# Patient Record
Sex: Male | Born: 1970 | Race: Black or African American | Hispanic: No | Marital: Married | Smoking: Former smoker
Health system: Southern US, Community
[De-identification: ages and names within clinical notes are randomized; demographics above are authoritative.]

## PROBLEM LIST (undated history)

## (undated) DIAGNOSIS — G4730 Sleep apnea, unspecified: Secondary | ICD-10-CM

## (undated) DIAGNOSIS — I82409 Acute embolism and thrombosis of unspecified deep veins of unspecified lower extremity: Secondary | ICD-10-CM

## (undated) DIAGNOSIS — J189 Pneumonia, unspecified organism: Secondary | ICD-10-CM

## (undated) DIAGNOSIS — I739 Peripheral vascular disease, unspecified: Secondary | ICD-10-CM

## (undated) DIAGNOSIS — I10 Essential (primary) hypertension: Secondary | ICD-10-CM

## (undated) DIAGNOSIS — M1990 Unspecified osteoarthritis, unspecified site: Secondary | ICD-10-CM

## (undated) DIAGNOSIS — M109 Gout, unspecified: Secondary | ICD-10-CM

## (undated) DIAGNOSIS — T8859XA Other complications of anesthesia, initial encounter: Secondary | ICD-10-CM

## (undated) DIAGNOSIS — K219 Gastro-esophageal reflux disease without esophagitis: Secondary | ICD-10-CM

## (undated) DIAGNOSIS — T4145XA Adverse effect of unspecified anesthetic, initial encounter: Secondary | ICD-10-CM

## (undated) HISTORY — PX: WISDOM TOOTH EXTRACTION: SHX21

---
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
## 2011-05-21 ENCOUNTER — Emergency Department (HOSPITAL_BASED_OUTPATIENT_CLINIC_OR_DEPARTMENT_OTHER)
Admission: EM | Admit: 2011-05-21 | Discharge: 2011-05-22 | Disposition: A | Discharge status: Home / Self Care | Payer: Self-pay | Attending: Emergency Medicine | Admitting: Emergency Medicine

## 2011-05-21 ENCOUNTER — Encounter (HOSPITAL_BASED_OUTPATIENT_CLINIC_OR_DEPARTMENT_OTHER): Payer: Self-pay | Admitting: *Deleted

## 2011-05-21 ENCOUNTER — Emergency Department (INDEPENDENT_AMBULATORY_CARE_PROVIDER_SITE_OTHER): Payer: Self-pay

## 2011-05-21 DIAGNOSIS — M25469 Effusion, unspecified knee: Secondary | ICD-10-CM | POA: Insufficient documentation

## 2011-05-21 DIAGNOSIS — M25569 Pain in unspecified knee: Secondary | ICD-10-CM

## 2011-05-21 DIAGNOSIS — I10 Essential (primary) hypertension: Secondary | ICD-10-CM | POA: Insufficient documentation

## 2011-05-21 DIAGNOSIS — M7989 Other specified soft tissue disorders: Secondary | ICD-10-CM

## 2011-05-21 DIAGNOSIS — Z79899 Other long term (current) drug therapy: Secondary | ICD-10-CM | POA: Insufficient documentation

## 2011-05-21 HISTORY — DX: Essential (primary) hypertension: I10

## 2011-05-21 MED ORDER — NAPROXEN 250 MG PO TABS
500.0000 mg | ORAL_TABLET | Freq: Once | ORAL | Status: AC
  Administered 2011-05-21: 500 mg via ORAL
  Filled 2011-05-21: qty 2

## 2011-05-21 MED ORDER — OXYCODONE-ACETAMINOPHEN 5-325 MG PO TABS
2.0000 | ORAL_TABLET | Freq: Once | ORAL | Status: AC
  Administered 2011-05-21: 2 via ORAL
  Filled 2011-05-21: qty 2

## 2011-05-21 NOTE — ED Notes (Signed)
Woke with left knee pain a week ago. Pain is getting worse each day. No known injury. Swelling and grinding noises per pt.

## 2011-05-22 MED ORDER — NAPROXEN 500 MG PO TABS: 500.0000 mg | ORAL_TABLET | Freq: Two times a day (BID) | ORAL | Status: AC

## 2011-05-22 NOTE — Discharge Instructions (Signed)
Your x-rays show a very small amount of fluid around your knee joint, please use the pain medication as prescribed and followup with the sports medicine Dr. or your family doctor.

## 2011-05-22 NOTE — ED Provider Notes (Signed)
History     CSN: [id number]  Arrival date & time 05/21/11  2236   First MD Initiated Contact with Patient 05/21/11 2341      Chief Complaint  Patient presents with  . Knee Pain    (Consider location/radiation/quality/duration/timing/severity/associated sxs/prior treatment) HPI Comments: Patient presents with gradual onset of left knee pain that started approximately 2 weeks ago. There was no inciting event, no identifiable trauma, has had associated mild swelling and decreased range of motion secondary to pain. The pain is persistent, worse with flexion and weightbearing, better with rest and elevation. He has had over-the-counter medications prior to arrival with minimal improvement. He denies associated fever, urethral discharge, rash. He denies trauma and has no other joint arthropathies  Patient is a 41 y.o. male presenting with knee pain. The history is provided by the patient and the spouse.  Knee Pain    Past Medical History  Diagnosis Date  . Hypertension     History reviewed. No pertinent past surgical history.  No family history on file.  History  Substance Use Topics  . Smoking status: Current Some Day Smoker  . Smokeless tobacco: Not on file  . Alcohol Use: Yes      Review of Systems  Constitutional: Negative for fever.  Gastrointestinal: Negative for nausea, vomiting and diarrhea.  Musculoskeletal: Positive for joint swelling.  Skin: Negative for rash.  Neurological: Negative for dizziness, weakness and numbness.    Allergies  Review of patient's allergies indicates no known allergies.  Home Medications   Current Outpatient Rx  Name Route Sig Dispense Refill  . BENAZEPRIL HCL PO Oral Take by mouth.    Marland Kitchen HYDROCHLOROTHIAZIDE PO Oral Take by mouth.    Marland Kitchen NAPROXEN 500 MG PO TABS Oral Take 1 tablet (500 mg total) by mouth 2 (two) times daily with a meal. 30 tablet 0    BP 124/82  Pulse 102  Temp(Src) 98.5 F (36.9 C) (Oral)  Resp 18  SpO2  100%  Physical Exam  Nursing note and vitals reviewed. Constitutional: He appears well-developed and well-nourished. No distress.  HENT:  Head: Normocephalic and atraumatic.  Eyes: Conjunctivae are normal. No scleral icterus.  Cardiovascular: Normal rate, regular rhythm and intact distal pulses.   Pulmonary/Chest: Effort normal and breath sounds normal.  Musculoskeletal: He exhibits tenderness ( Left knee with mild tenderness to the joint, pain with range of motion, no obvious effusions or swelling or asymmetry or redness or warmth. No crepitance with range of motion, no other joint abnormalities). He exhibits no edema.  Neurological: He is alert.  Skin: Skin is warm and dry. No rash noted. He is not diaphoretic.    ED Course  Procedures (including critical care time)  Labs Reviewed - No data to display Dg Knee 1-2 Views Left  05/22/2011  *RADIOLOGY REPORT*  Clinical Data: Left knee pain and swelling.  LEFT KNEE - 1-2 VIEW  Comparison: None  Findings: There is no evidence of fracture, subluxation or dislocation. There may be a small knee effusion present. No focal bony lesions are identified.  IMPRESSION: Question small knee effusion - no evidence of bony abnormality.  Original Report Authenticated By: Rosendo Gros, M.D.     1. Knee pain       MDM  Overall the patient appears well without fever or tachycardia. He has focal arthropathy of the left knee which could be early gouty arthritis versus inflammatory arthritis. Doubt hemarthrosis, doubt septic arthritis given history physical exam and vital signs,  see below.  Filed Vitals:   05/21/11 2248  BP: 124/82  Pulse: 102  Temp: 98.5 F (36.9 C)  Resp: 18   X-rays negative for acute fracture or abnormality, oral Percocet and Naprosyn given in the emergency department  Discharge Prescriptions include:  Naprosyn      Vida Roller, MD 05/22/11 0130

## 2013-11-19 ENCOUNTER — Encounter (HOSPITAL_BASED_OUTPATIENT_CLINIC_OR_DEPARTMENT_OTHER): Payer: Self-pay | Admitting: Emergency Medicine

## 2013-11-19 ENCOUNTER — Emergency Department (HOSPITAL_BASED_OUTPATIENT_CLINIC_OR_DEPARTMENT_OTHER)
Admission: EM | Admit: 2013-11-19 | Discharge: 2013-11-19 | Disposition: A | Discharge status: Home / Self Care | Payer: BC Managed Care – PPO | Attending: Emergency Medicine | Admitting: Emergency Medicine

## 2013-11-19 ENCOUNTER — Emergency Department (HOSPITAL_BASED_OUTPATIENT_CLINIC_OR_DEPARTMENT_OTHER): Payer: BC Managed Care – PPO

## 2013-11-19 DIAGNOSIS — F17200 Nicotine dependence, unspecified, uncomplicated: Secondary | ICD-10-CM | POA: Insufficient documentation

## 2013-11-19 DIAGNOSIS — IMO0002 Reserved for concepts with insufficient information to code with codable children: Secondary | ICD-10-CM | POA: Diagnosis not present

## 2013-11-19 DIAGNOSIS — S59902A Unspecified injury of left elbow, initial encounter: Secondary | ICD-10-CM

## 2013-11-19 DIAGNOSIS — S6990XA Unspecified injury of unspecified wrist, hand and finger(s), initial encounter: Principal | ICD-10-CM

## 2013-11-19 DIAGNOSIS — S59909A Unspecified injury of unspecified elbow, initial encounter: Secondary | ICD-10-CM | POA: Diagnosis present

## 2013-11-19 DIAGNOSIS — Y9289 Other specified places as the place of occurrence of the external cause: Secondary | ICD-10-CM | POA: Diagnosis not present

## 2013-11-19 DIAGNOSIS — W010XXA Fall on same level from slipping, tripping and stumbling without subsequent striking against object, initial encounter: Secondary | ICD-10-CM | POA: Insufficient documentation

## 2013-11-19 DIAGNOSIS — I10 Essential (primary) hypertension: Secondary | ICD-10-CM | POA: Diagnosis not present

## 2013-11-19 DIAGNOSIS — Y9389 Activity, other specified: Secondary | ICD-10-CM | POA: Insufficient documentation

## 2013-11-19 DIAGNOSIS — S59919A Unspecified injury of unspecified forearm, initial encounter: Principal | ICD-10-CM

## 2013-11-19 MED ORDER — OXYCODONE-ACETAMINOPHEN 5-325 MG PO TABS: 1.0000 | ORAL_TABLET | ORAL | Status: DC | PRN

## 2013-11-19 MED ORDER — OXYCODONE-ACETAMINOPHEN 5-325 MG PO TABS
2.0000 | ORAL_TABLET | Freq: Once | ORAL | Status: AC
Start: 2013-11-19 — End: 2013-11-19
  Administered 2013-11-19: 2 via ORAL
  Filled 2013-11-19: qty 2

## 2013-11-19 NOTE — ED Notes (Signed)
Pt given ice pack for home use and note for work

## 2013-11-19 NOTE — ED Notes (Signed)
MD at bedside. 

## 2013-11-19 NOTE — ED Provider Notes (Signed)
CSN: [id number]     Arrival date & time 11/19/13  1652 History  This chart was scribed for Hilario Quarry, MD by Tonye Royalty, ED Scribe. This patient was seen in room MH09/MH09 and the patient's care was started at 6:16 PM.    Chief Complaint  Patient presents with  . Arm Pain   Patient is a 43 y.o. male presenting with extremity pain. The history is provided by the patient. No language interpreter was used.  Extremity Pain This is a new problem. The current episode started 3 to 5 hours ago. The problem occurs constantly. The problem has not changed since onset.The symptoms are aggravated by bending and twisting. Nothing relieves the symptoms.    HPI Comments: Ricardo Heath is a 43 y.o. male who presents to the Emergency Department complaining of left arm pain, mostly in the elbow, with onset at 2:00PM today after slipping in the raining and falling, landing on his left arm. He states he did not land on a bent elbow. He reports minor associated back pain that resolved but no pain elsewhere. He reports prior history of "issues" to his left elbow for which he has an appointment scheduled tomorrow with orthopedist Dr. Luiz Blare. He reports history of hypertension. He reports some smoking and drinking. He reports alcohol use today prior to the fall.  Past Medical History  Diagnosis Date  . Hypertension    History reviewed. No pertinent past surgical history. No family history on file. History  Substance Use Topics  . Smoking status: Current Some Day Smoker  . Smokeless tobacco: Not on file  . Alcohol Use: Yes    Review of Systems  Musculoskeletal: Positive for back pain.       Left arm pain  Neurological: Negative for numbness.  All other systems reviewed and are negative.     Allergies  Review of patient's allergies indicates no known allergies.  Home Medications   Prior to Admission medications   Medication Sig Start Date End Date Taking? Authorizing Provider  BENAZEPRIL HCL PO  Take by mouth.    Historical Provider, MD  HYDROCHLOROTHIAZIDE PO Take by mouth.    Historical Provider, MD   BP 92/61  Pulse 117  Temp(Src) 98.5 F (36.9 C) (Oral)  Resp 20  Ht  (1.702 m)  Wt 255 lb (115.667 kg)  BMI 39.93 kg/m2  SpO2 99% Physical Exam  Nursing note and vitals reviewed. Constitutional: He is oriented to person, place, and time. He appears well-developed and well-nourished. No distress.  HENT:  Head: Normocephalic and atraumatic.  Right Ear: External ear normal.  Left Ear: External ear normal.  Nose: Nose normal.  Eyes: EOM are normal.  Neck: Normal range of motion. Neck supple.  Musculoskeletal: Normal range of motion. He exhibits tenderness.  Neurological: He is alert and oriented to person, place, and time. He exhibits normal muscle tone. Coordination normal.  Skin: Skin is warm and dry.  Psychiatric: He has a normal mood and affect. His behavior is normal. Thought content normal.    ED Course  Procedures (including critical care time) Labs Review Labs Reviewed - No data to display  Imaging Review No results found.   EKG Interpretation None     DIAGNOSTIC STUDIES: Oxygen Saturation is 99% on room air, normal by my interpretation.    COORDINATION OF CARE: 6:11 PM Discussed treatment plan with patient at beside, the patient agrees with the plan and has no further questions at this time.  MDM   Final diagnoses:  Elbow injury, left, initial encounter    I personally performed the services described in this documentation, which was scribed in my presence. The recorded information has been reviewed and considered.  Hilario Quarry, MD 11/20/13 [phone]

## 2013-11-19 NOTE — ED Notes (Signed)
EMT at bedside applying splint 

## 2013-11-19 NOTE — ED Notes (Signed)
Pt presents to ED with complaints of left arm pain after falling today on his deck pt states he slipped in the rain and fell. Swelling noted to elbow, pt crying in triage. Pt states he already has "issues" with the arm and has an appointment scheduled for tomorrow with ortho doc.

## 2013-11-19 NOTE — Discharge Instructions (Signed)
Please keep cold therapy and sling until recheck with orthopedist.

## 2015-01-11 ENCOUNTER — Encounter (HOSPITAL_BASED_OUTPATIENT_CLINIC_OR_DEPARTMENT_OTHER): Payer: Self-pay | Admitting: *Deleted

## 2015-01-11 ENCOUNTER — Emergency Department (HOSPITAL_BASED_OUTPATIENT_CLINIC_OR_DEPARTMENT_OTHER)
Admission: EM | Admit: 2015-01-11 | Discharge: 2015-01-11 | Disposition: A | Discharge status: Home / Self Care | Payer: BLUE CROSS/BLUE SHIELD | Attending: Emergency Medicine | Admitting: Emergency Medicine

## 2015-01-11 DIAGNOSIS — Z79899 Other long term (current) drug therapy: Secondary | ICD-10-CM | POA: Insufficient documentation

## 2015-01-11 DIAGNOSIS — S39012A Strain of muscle, fascia and tendon of lower back, initial encounter: Secondary | ICD-10-CM | POA: Insufficient documentation

## 2015-01-11 DIAGNOSIS — X58XXXA Exposure to other specified factors, initial encounter: Secondary | ICD-10-CM | POA: Insufficient documentation

## 2015-01-11 DIAGNOSIS — S3992XA Unspecified injury of lower back, initial encounter: Secondary | ICD-10-CM | POA: Diagnosis present

## 2015-01-11 DIAGNOSIS — F17210 Nicotine dependence, cigarettes, uncomplicated: Secondary | ICD-10-CM | POA: Diagnosis not present

## 2015-01-11 DIAGNOSIS — Y998 Other external cause status: Secondary | ICD-10-CM | POA: Insufficient documentation

## 2015-01-11 DIAGNOSIS — I10 Essential (primary) hypertension: Secondary | ICD-10-CM | POA: Insufficient documentation

## 2015-01-11 DIAGNOSIS — Y9289 Other specified places as the place of occurrence of the external cause: Secondary | ICD-10-CM | POA: Diagnosis not present

## 2015-01-11 DIAGNOSIS — Y9389 Activity, other specified: Secondary | ICD-10-CM | POA: Insufficient documentation

## 2015-01-11 MED ORDER — MELOXICAM 15 MG PO TABS: 15.0000 mg | ORAL_TABLET | Freq: Every day | ORAL | Status: DC | PRN

## 2015-01-11 MED ORDER — TRAMADOL HCL 50 MG PO TABS: 50.0000 mg | ORAL_TABLET | Freq: Four times a day (QID) | ORAL | Status: DC | PRN

## 2015-01-11 MED ORDER — DEXAMETHASONE 4 MG PO TABS
12.0000 mg | ORAL_TABLET | Freq: Once | ORAL | Status: AC
  Administered 2015-01-11: 12 mg via ORAL
  Filled 2015-01-11: qty 3

## 2015-01-11 MED ORDER — HYDROMORPHONE HCL 1 MG/ML IJ SOLN: 1.0000 mg | Freq: Once | INTRAMUSCULAR | Status: DC

## 2015-01-11 MED ORDER — KETOROLAC TROMETHAMINE 30 MG/ML IJ SOLN
30.0000 mg | Freq: Once | INTRAMUSCULAR | Status: AC
  Administered 2015-01-11: 30 mg via INTRAMUSCULAR
  Filled 2015-01-11: qty 1

## 2015-01-11 NOTE — Discharge Instructions (Signed)
Back Injury Prevention Back injuries can be very painful. They can also be difficult to heal. After having one back injury, you are more likely to injure your back again. It is important to learn how to avoid injuring or re-injuring your back. The following tips can help you to prevent a back injury. WHAT SHOULD I KNOW ABOUT PHYSICAL FITNESS?  Exercise for 30 minutes per day on most days of the week or as directed by your health care provider. Make sure to:  Do aerobic exercises, such as walking, jogging, biking, or swimming.  Do exercises that increase balance and strength, such as tai chi and yoga. These can decrease your risk of falling and injuring your back.  Do stretching exercises to help with flexibility.  Try to develop strong abdominal muscles. Your abdominal muscles provide a lot of the support that is needed by your back.  Maintain a healthy weight. This helps to decrease your risk of a back injury. WHAT SHOULD I KNOW ABOUT MY DIET?  Talk with your health care provider about your overall diet. Take supplements and vitamins only as directed by your health care provider.  Talk with your health care provider about how much calcium and vitamin D you need each day. These nutrients help to prevent weakening of the bones (osteoporosis). Osteoporosis can cause broken (fractured) bones, which lead to back pain.  Include good sources of calcium in your diet, such as dairy products, green leafy vegetables, and products that have had calcium added to them (fortified).  Include good sources of vitamin D in your diet, such as milk and foods that are fortified with vitamin D. WHAT SHOULD I KNOW ABOUT MY POSTURE?  Sit up straight and stand up straight. Avoid leaning forward when you sit or hunching over when you stand.  Choose chairs that have good low-back (lumbar) support.  If you work at a desk, sit close to it so you do not need to lean over. Keep your chin tucked in. Keep your neck  drawn back, and keep your elbows bent at a right angle. Your arms should look like the letter "L."  Sit high and close to the steering wheel when you drive. Add a lumbar support to your car seat, if needed.  Avoid sitting or standing in one position for very long. Take breaks to get up, stretch, and walk around at least one time every hour. Take breaks every hour if you are driving for long periods of time.  Sleep on your side with your knees slightly bent, or sleep on your back with a pillow under your knees. Do not lie on the front of your body to sleep. WHAT SHOULD I KNOW ABOUT LIFTING, TWISTING, AND REACHING? Lifting and Heavy Lifting  Avoid heavy lifting, especially repetitive heavy lifting. If you must do heavy lifting:  Stretch before lifting.  Work slowly.  Rest between lifts.  Use a tool such as a cart or a dolly to move objects if one is available.  Make several small trips instead of carrying one heavy load.  Ask for help when you need it, especially when moving big objects.  Follow these steps when lifting:  Stand with your feet shoulder-width apart.  Get as close to the object as you can. Do not try to pick up a heavy object that is far from your body.  Use handles or lifting straps if they are available.  Bend at your knees. Squat down, but keep your heels off the floor.  Keep your shoulders pulled back, your chin tucked in, and your back straight.  Lift the object slowly while you tighten the muscles in your legs, abdomen, and buttocks. Keep the object as close to the center of your body as possible.  Follow these steps when putting down a heavy load:  Stand with your feet shoulder-width apart.  Lower the object slowly while you tighten the muscles in your legs, abdomen, and buttocks. Keep the object as close to the center of your body as possible.  Keep your shoulders pulled back, your chin tucked in, and your back straight.  Bend at your knees. Squat  down, but keep your heels off the floor.  Use handles or lifting straps if they are available. Twisting and Reaching  Avoid lifting heavy objects above your waist.  Do not twist at your waist while you are lifting or carrying a load. If you need to turn, move your feet.  Do not bend over without bending at your knees.  Avoid reaching over your head, across a table, or for an object on a high surface. WHAT ARE SOME OTHER TIPS?  Avoid wet floors and icy ground. Keep sidewalks clear of ice to prevent falls.  Do not sleep on a mattress that is too soft or too hard.  Keep items that are used frequently within easy reach.  Put heavier objects on shelves at waist level, and put lighter objects on lower or higher shelves.  Find ways to decrease your stress, such as exercise, massage, or relaxation techniques. Stress can build up in your muscles. Tense muscles are more vulnerable to injury.  Talk with your health care provider if you feel anxious or depressed. These conditions can make back pain worse.  Wear flat heel shoes with cushioned soles.  Avoid sudden movements.  Use both shoulder straps when carrying a backpack.  Do not use any tobacco products, including cigarettes, chewing tobacco, or electronic cigarettes. If you need help quitting, ask your health care provider.   This information is not intended to replace advice given to you by your health care provider. Make sure you discuss any questions you have with your health care provider.   Document Released: 03/19/2004 Document Revised: 06/26/2014 Document Reviewed: 02/13/2014 [employer] Interactive Patient Education 2016 [employer] [facility] A sprain is an injury in which a ligament is torn. The ligaments of the lower back are vulnerable to sprains. However, they are strong and require great force to be injured. These ligaments are important for stabilizing the spinal column. Sprains are classified into  three categories. Grade 1 sprains cause pain, but the tendon is not lengthened. Grade 2 sprains include a lengthened ligament, due to the ligament being stretched or partially ruptured. With grade 2 sprains there is still function, although the function may be decreased. Grade 3 sprains involve a complete tear of the tendon or muscle, and function is usually impaired. SYMPTOMS   Severe pain in the lower back.  Sometimes, a feeling of a "pop," "snap," or tear, at the time of injury.  Tenderness and sometimes swelling at the injury site.  Uncommonly, bruising (contusion) within 48 hours of injury.  Muscle spasms in the back. CAUSES  Low back sprains occur when a force is placed on the ligaments that is greater than they can handle. Common causes of injury include:  Performing a stressful act while off-balance.  Repetitive stressful activities that involve movement of the lower back.  Direct hit (trauma)  to the lower back. RISK INCREASES WITH:  Contact sports (football, wrestling).  Collisions (major skiing accidents).  Sports that require throwing or lifting (baseball, weightlifting).  Sports involving twisting of the spine (gymnastics, diving, tennis, golf).  Poor strength and flexibility.  Inadequate protection.  Previous back injury or surgery (especially fusion). PREVENTION  Wear properly fitted and padded protective equipment.  Warm up and stretch properly before activity.  Allow for adequate recovery between workouts.  Maintain physical fitness:  Strength, flexibility, and endurance.  Cardiovascular fitness.  Maintain a healthy body weight. PROGNOSIS  If treated properly, low back sprains usually heal with non-surgical treatment. The length of time for healing depends on the severity of the injury.  RELATED COMPLICATIONS   Recurring symptoms, resulting in a chronic problem.  Chronic inflammation and pain in the low back.  Delayed healing or resolution of  symptoms, especially if activity is resumed too soon.  Prolonged impairment.  Unstable or arthritic joints of the low back. TREATMENT  Treatment first involves the use of ice and medicine, to reduce pain and inflammation. The use of strengthening and stretching exercises may help reduce pain with activity. These exercises may be performed at home or with a therapist. Severe injuries may require referral to a therapist for further evaluation and treatment, such as ultrasound. Your caregiver may advise that you wear a back brace or corset, to help reduce pain and discomfort. Often, prolonged bed rest results in greater harm then benefit. Corticosteroid injections may be recommended. However, these should be reserved for the most serious cases. It is important to avoid using your back when lifting objects. At night, sleep on your back on a firm mattress, with a pillow placed under your knees. If non-surgical treatment is unsuccessful, surgery may be needed.  MEDICATION   If pain medicine is needed, nonsteroidal anti-inflammatory medicines (aspirin and ibuprofen), or other minor pain relievers (acetaminophen), are often advised.  Do not take pain medicine for 7 days before surgery.  Prescription pain relievers may be given, if your caregiver thinks they are needed. Use only as directed and only as much as you need.  Ointments applied to the skin may be helpful.  Corticosteroid injections may be given by your caregiver. These injections should be reserved for the most serious cases, because they may only be given a certain number of times. HEAT AND COLD  Cold treatment (icing) should be applied for 10 to 15 minutes every 2 to 3 hours for inflammation and pain, and immediately after activity that aggravates your symptoms. Use ice packs or an ice massage.  Heat treatment may be used before performing stretching and strengthening activities prescribed by your caregiver, physical therapist, or athletic  trainer. Use a heat pack or a warm water soak. SEEK MEDICAL CARE IF:   Symptoms get worse or do not improve in 2 to 4 weeks, despite treatment.  You develop numbness or weakness in either leg.  You lose bowel or bladder function.  Any of the following occur after surgery: fever, increased pain, swelling, redness, drainage of fluids, or bleeding in the affected area.  New, unexplained symptoms develop. (Drugs used in treatment may produce side effects.) EXERCISES  RANGE OF MOTION (ROM) AND STRETCHING EXERCISES - Low Back Sprain Most people with lower back pain will find that their symptoms get worse with excessive bending forward (flexion) or arching at the lower back (extension). The exercises that will help resolve your symptoms will focus on the opposite motion.  Your physician,  physical therapist or athletic trainer will help you determine which exercises will be most helpful to resolve your lower back pain. Do not complete any exercises without first consulting with your caregiver. Discontinue any exercises which make your symptoms worse, until you speak to your caregiver. If you have pain, numbness or tingling which travels down into your buttocks, leg or foot, the goal of the therapy is for these symptoms to move closer to your back and eventually resolve. Sometimes, these leg symptoms will get better, but your lower back pain may worsen. This is often an indication of progress in your rehabilitation. Be very alert to any changes in your symptoms and the activities in which you participated in the 24 hours prior to the change. Sharing this information with your caregiver will allow him or her to most efficiently treat your condition. These exercises may help you when beginning to rehabilitate your injury. Your symptoms may resolve with or without further involvement from your physician, physical therapist or athletic trainer. While completing these exercises, remember:   Restoring tissue  flexibility helps normal motion to return to the joints. This allows healthier, less painful movement and activity.  An effective stretch should be held for at least 30 seconds.  A stretch should never be painful. You should only feel a gentle lengthening or release in the stretched tissue. FLEXION RANGE OF MOTION AND STRETCHING EXERCISES: STRETCH - Flexion, Single Knee to Chest   Lie on a firm bed or floor with both legs extended in front of you.  Keeping one leg in contact with the floor, bring your opposite knee to your chest. Hold your leg in place by either grabbing behind your thigh or at your knee.  Pull until you feel a gentle stretch in your low back. Hold __________ seconds.  Slowly release your grasp and repeat the exercise with the opposite side. Repeat __________ times. Complete this exercise __________ times per day.  STRETCH - Flexion, Double Knee to Chest  Lie on a firm bed or floor with both legs extended in front of you.  Keeping one leg in contact with the floor, bring your opposite knee to your chest.  Tense your stomach muscles to support your back and then lift your other knee to your chest. Hold your legs in place by either grabbing behind your thighs or at your knees.  Pull both knees toward your chest until you feel a gentle stretch in your low back. Hold __________ seconds.  Tense your stomach muscles and slowly return one leg at a time to the floor. Repeat __________ times. Complete this exercise __________ times per day.  STRETCH - Low Trunk Rotation  Lie on a firm bed or floor. Keeping your legs in front of you, bend your knees so they are both pointed toward the ceiling and your feet are flat on the floor.  Extend your arms out to the side. This will stabilize your upper body by keeping your shoulders in contact with the floor.  Gently and slowly drop both knees together to one side until you feel a gentle stretch in your low back. Hold for __________  seconds.  Tense your stomach muscles to support your lower back as you bring your knees back to the starting position. Repeat the exercise to the other side. Repeat __________ times. Complete this exercise __________ times per day  EXTENSION RANGE OF MOTION AND FLEXIBILITY EXERCISES: STRETCH - Extension, Prone on Elbows   Lie on your stomach on the floor,  a bed will be too soft. Place your palms about shoulder width apart and at the height of your head.  Place your elbows under your shoulders. If this is too painful, stack pillows under your chest.  Allow your body to relax so that your hips drop lower and make contact more completely with the floor.  Hold this position for __________ seconds.  Slowly return to lying flat on the floor. Repeat __________ times. Complete this exercise __________ times per day.  RANGE OF MOTION - Extension, Prone Press Ups  Lie on your stomach on the floor, a bed will be too soft. Place your palms about shoulder width apart and at the height of your head.  Keeping your back as relaxed as possible, slowly straighten your elbows while keeping your hips on the floor. You may adjust the placement of your hands to maximize your comfort. As you gain motion, your hands will come more underneath your shoulders.  Hold this position __________ seconds.  Slowly return to lying flat on the floor. Repeat __________ times. Complete this exercise __________ times per day.  RANGE OF MOTION- Quadruped, Neutral Spine   Assume a hands and knees position on a firm surface. Keep your hands under your shoulders and your knees under your hips. You may place padding under your knees for comfort.  Drop your head and point your tailbone toward the ground below you. This will round out your lower back like an angry cat. Hold this position for __________ seconds.  Slowly lift your head and release your tail bone so that your back sags into a large arch, like an old horse.  Hold  this position for __________ seconds.  Repeat this until you feel limber in your low back.  Now, find your "sweet spot." This will be the most comfortable position somewhere between the two previous positions. This is your neutral spine. Once you have found this position, tense your stomach muscles to support your low back.  Hold this position for __________ seconds. Repeat __________ times. Complete this exercise __________ times per day.  STRENGTHENING EXERCISES - Low Back Sprain These exercises may help you when beginning to rehabilitate your injury. These exercises should be done near your "sweet spot." This is the neutral, low-back arch, somewhere between fully rounded and fully arched, that is your least painful position. When performed in this safe range of motion, these exercises can be used for people who have either a flexion or extension based injury. These exercises may resolve your symptoms with or without further involvement from your physician, physical therapist or athletic trainer. While completing these exercises, remember:   Muscles can gain both the endurance and the strength needed for everyday activities through controlled exercises.  Complete these exercises as instructed by your physician, physical therapist or athletic trainer. Increase the resistance and repetitions only as guided.  You may experience muscle soreness or fatigue, but the pain or discomfort you are trying to eliminate should never worsen during these exercises. If this pain does worsen, stop and make certain you are following the directions exactly. If the pain is still present after adjustments, discontinue the exercise until you can discuss the trouble with your caregiver. STRENGTHENING - Deep Abdominals, Pelvic Tilt   Lie on a firm bed or floor. Keeping your legs in front of you, bend your knees so they are both pointed toward the ceiling and your feet are flat on the floor.  Tense your lower abdominal  muscles to press your  low back into the floor. This motion will rotate your pelvis so that your tail bone is scooping upwards rather than pointing at your feet or into the floor. With a gentle tension and even breathing, hold this position for __________ seconds. Repeat __________ times. Complete this exercise __________ times per day.  STRENGTHENING - Abdominals, Crunches   Lie on a firm bed or floor. Keeping your legs in front of you, bend your knees so they are both pointed toward the ceiling and your feet are flat on the floor. Cross your arms over your chest.  Slightly tip your chin down without bending your neck.  Tense your abdominals and slowly lift your trunk high enough to just clear your shoulder blades. Lifting higher can put excessive stress on the lower back and does not further strengthen your abdominal muscles.  Control your return to the starting position. Repeat __________ times. Complete this exercise __________ times per day.  STRENGTHENING - Quadruped, Opposite UE/LE Lift   Assume a hands and knees position on a firm surface. Keep your hands under your shoulders and your knees under your hips. You may place padding under your knees for comfort.  Find your neutral spine and gently tense your abdominal muscles so that you can maintain this position. Your shoulders and hips should form a rectangle that is parallel with the floor and is not twisted.  Keeping your trunk steady, lift your right hand no higher than your shoulder and then your left leg no higher than your hip. Make sure you are not holding your breath. Hold this position for __________ seconds.  Continuing to keep your abdominal muscles tense and your back steady, slowly return to your starting position. Repeat with the opposite arm and leg. Repeat __________ times. Complete this exercise __________ times per day.  STRENGTHENING - Abdominals and Quadriceps, Straight Leg Raise   Lie on a firm bed or floor with  both legs extended in front of you.  Keeping one leg in contact with the floor, bend the other knee so that your foot can rest flat on the floor.  Find your neutral spine, and tense your abdominal muscles to maintain your spinal position throughout the exercise.  Slowly lift your straight leg off the floor about 6 inches for a count of 15, making sure to not hold your breath.  Still keeping your neutral spine, slowly lower your leg all the way to the floor. Repeat this exercise with each leg __________ times. Complete this exercise __________ times per day. POSTURE AND BODY MECHANICS CONSIDERATIONS - Low Back Sprain Keeping correct posture when sitting, standing or completing your activities will reduce the stress put on different body tissues, allowing injured tissues a chance to heal and limiting painful experiences. The following are general guidelines for improved posture. Your physician or physical therapist will provide you with any instructions specific to your needs. While reading these guidelines, remember:  The exercises prescribed by your provider will help you have the flexibility and strength to maintain correct postures.  The correct posture provides the best environment for your joints to work. All of your joints have less wear and tear when properly supported by a spine with good posture. This means you will experience a healthier, less painful body.  Correct posture must be practiced with all of your activities, especially prolonged sitting and standing. Correct posture is as important when doing repetitive low-stress activities (typing) as it is when doing a single heavy-load activity (lifting). RESTING POSITIONS Consider  which positions are most painful for you when choosing a resting position. If you have pain with flexion-based activities (sitting, bending, stooping, squatting), choose a position that allows you to rest in a less flexed posture. You would want to avoid curling  into a fetal position on your side. If your pain worsens with extension-based activities (prolonged standing, working overhead), avoid resting in an extended position such as sleeping on your stomach. Most people will find more comfort when they rest with their spine in a more neutral position, neither too rounded nor too arched. Lying on a non-sagging bed on your side with a pillow between your knees, or on your back with a pillow under your knees will often provide some relief. Keep in mind, being in any one position for a prolonged period of time, no matter how correct your posture, can still lead to stiffness. PROPER SITTING POSTURE In order to minimize stress and discomfort on your spine, you must sit with correct posture. Sitting with good posture should be effortless for a healthy body. Returning to good posture is a gradual process. Many people can work toward this most comfortably by using various supports until they have the flexibility and strength to maintain this posture on their own. When sitting with proper posture, your ears will fall over your shoulders and your shoulders will fall over your hips. You should use the back of the chair to support your upper back. Your lower back will be in a neutral position, just slightly arched. You may place a small pillow or folded towel at the base of your lower back for  support.  When working at a desk, create an environment that supports good, upright posture. Without extra support, muscles tire, which leads to excessive strain on joints and other tissues. Keep these recommendations in mind: CHAIR:  A chair should be able to slide under your desk when your back makes contact with the back of the chair. This allows you to work closely.  The chair's height should allow your eyes to be level with the upper part of your monitor and your hands to be slightly lower than your elbows. BODY POSITION  Your feet should make contact with the floor. If this is  not possible, use a foot rest.  Keep your ears over your shoulders. This will reduce stress on your neck and low back. INCORRECT SITTING POSTURES  If you are feeling tired and unable to assume a healthy sitting posture, do not slouch or slump. This puts excessive strain on your back tissues, causing more damage and pain. Healthier options include:  Using more support, like a lumbar pillow.  Switching tasks to something that requires you to be upright or walking.  Talking a brief walk.  Lying down to rest in a neutral-spine position. PROLONGED STANDING WHILE SLIGHTLY LEANING FORWARD  When completing a task that requires you to lean forward while standing in one place for a long time, place either foot up on a stationary 2-4 inch high object to help maintain the best posture. When both feet are on the ground, the lower back tends to lose its slight inward curve. If this curve flattens (or becomes too large), then the back and your other joints will experience too much stress, tire more quickly, and can cause pain. CORRECT STANDING POSTURES Proper standing posture should be assumed with all daily activities, even if they only take a few moments, like when brushing your teeth. As in sitting, your ears should fall  over your shoulders and your shoulders should fall over your hips. You should keep a slight tension in your abdominal muscles to brace your spine. Your tailbone should point down to the ground, not behind your body, resulting in an over-extended swayback posture.  INCORRECT STANDING POSTURES  Common incorrect standing postures include a forward head, locked knees and/or an excessive swayback. WALKING Walk with an upright posture. Your ears, shoulders and hips should all line-up. PROLONGED ACTIVITY IN A FLEXED POSITION When completing a task that requires you to bend forward at your waist or lean over a low surface, try to find a way to stabilize 3 out of 4 of your limbs. You can place a  hand or elbow on your thigh or rest a knee on the surface you are reaching across. This will provide you more stability, so that your muscles do not tire as quickly. By keeping your knees relaxed, or slightly bent, you will also reduce stress across your lower back. CORRECT LIFTING TECHNIQUES DO :  Assume a wide stance. This will provide you more stability and the opportunity to get as close as possible to the object which you are lifting.  Tense your abdominals to brace your spine. Bend at the knees and hips. Keeping your back locked in a neutral-spine position, lift using your leg muscles. Lift with your legs, keeping your back straight.  Test the weight of unknown objects before attempting to lift them.  Try to keep your elbows locked down at your sides in order get the best strength from your shoulders when carrying an object.  Always ask for help when lifting heavy or awkward objects. INCORRECT LIFTING TECHNIQUES DO NOT:   Lock your knees when lifting, even if it is a small object.  Bend and twist. Pivot at your feet or move your feet when needing to change directions.  Assume that you can safely pick up even a paperclip without proper posture.   This information is not intended to replace advice given to you by your health care provider. Make sure you discuss any questions you have with your health care provider.   Document Released: 02/09/2005 Document Revised: 03/02/2014 Document Reviewed: 05/24/2008 Elsevier Interactive Patient Education [employer].
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
## 2015-01-26 NOTE — ED Provider Notes (Signed)
CSN: [id number]     Arrival date & time 01/11/15  [id number] History   First MD Initiated Contact with Patient 01/11/15 1026     Chief Complaint  Patient presents with  . Back Pain     (Consider location/radiation/quality/duration/timing/severity/associated sxs/prior Treatment) HPI   44 year old male with lower back pain. Patient injured his lower back approximately 3 months ago. The pain was improving until last couple weeks and began worsening again. Denies any acute trauma. No fevers or chills. Mild ache at rest which is worse with movement. The pain will radiate down into both buttocks and posterior thighs. No numbness or tingling. No urinary complaints. No fevers or chills. Denies any IV drug use or use of blood thinning medication.  Past Medical History  Diagnosis Date  . Hypertension    History reviewed. No pertinent past surgical history. No family history on file. Social History  Substance Use Topics  . Smoking status: Current Some Day Smoker    Types: Cigarettes  . Smokeless tobacco: None  . Alcohol Use: Yes     Comment: occ    Review of Systems  All systems reviewed and negative, other than as noted in HPI.   Allergies  Review of patient's allergies indicates no known allergies.  Home Medications   Prior to Admission medications   Medication Sig Start Date End Date Taking? Authorizing Provider  benazepril-hydrochlorthiazide (LOTENSIN HCT) 20-12.5 MG tablet Take 1 tablet by mouth daily.   Yes Historical Provider, MD  BENAZEPRIL HCL PO Take by mouth.    Historical Provider, MD  HYDROCHLOROTHIAZIDE PO Take by mouth.    Historical Provider, MD  meloxicam (MOBIC) 15 MG tablet Take 1 tablet (15 mg total) by mouth daily as needed for pain. 01/11/15   Raeford RazorStephen Sofie Schendel, MD  oxyCODONE-acetaminophen (PERCOCET/ROXICET) 5-325 MG per tablet Take 1 tablet by mouth every 4 (four) hours as needed for severe pain. 11/19/13   Margarita Grizzleanielle Ray, MD  traMADol (ULTRAM) 50 MG tablet Take 1 tablet  (50 mg total) by mouth every 6 (six) hours as needed. 01/11/15   Raeford RazorStephen Honestii Marton, MD   BP 146/99 mmHg  Pulse 100  Temp(Src) 98 F (36.7 C) (Oral)  Resp 18  Ht 5\' 7"  (1.702 m)  Wt 260 lb (117.935 kg)  BMI 40.71 kg/m2  SpO2 100% Physical Exam  Constitutional: He appears well-developed and well-nourished. No distress.  HENT:  Head: Normocephalic and atraumatic.  Eyes: Conjunctivae are normal. Right eye exhibits no discharge. Left eye exhibits no discharge.  Neck: Neck supple.  Cardiovascular: Normal rate, regular rhythm and normal heart sounds.  Exam reveals no gallop and no friction rub.   No murmur heard. Pulmonary/Chest: Effort normal and breath sounds normal. No respiratory distress.  Abdominal: Soft. He exhibits no distension. There is no tenderness.  Musculoskeletal: He exhibits no edema or tenderness.  Mild tenderness to palpation across the lower back lumbar region paraspinally and in the midline. It is not simply worse in the midline. There are no concerning skin changes. Strength is 5 out of 5 bilateral lower extremities. Able to actively range both hips without any difficulty although he does complain of some increased pain with hip flexion. Sensation is intact to light touch. Palpable DP pulses.  Neurological: He is alert.  Skin: Skin is warm and dry.  Psychiatric: He has a normal mood and affect. His behavior is normal. Thought content normal.  Nursing note and vitals reviewed.   ED Course  Procedures (including critical care time) Labs Review  Labs Reviewed - No data to display  Imaging Review No results found. I have personally reviewed and evaluated these images and lab results as part of my medical decision-making.   EKG Interpretation None      MDM   Final diagnoses:  Lumbosacral strain, initial encounter    45 year old male with likely lumbosacral strain. Afebrile. Well-appearing. Nonfocal neurological examination. Denies any history of IV drug abuse. No  blood thinners. No particular concerning features. Plan symptomatic treatment. Return precautions were discussed.  Raeford Razor, MD 01/26/15 [phone]

## 2015-06-11 ENCOUNTER — Emergency Department (HOSPITAL_BASED_OUTPATIENT_CLINIC_OR_DEPARTMENT_OTHER)
Admission: EM | Admit: 2015-06-11 | Discharge: 2015-06-11 | Disposition: A | Discharge status: Home / Self Care | Payer: Worker's Compensation | Attending: Emergency Medicine | Admitting: Emergency Medicine

## 2015-06-11 ENCOUNTER — Emergency Department (HOSPITAL_BASED_OUTPATIENT_CLINIC_OR_DEPARTMENT_OTHER): Payer: Worker's Compensation

## 2015-06-11 ENCOUNTER — Encounter (HOSPITAL_BASED_OUTPATIENT_CLINIC_OR_DEPARTMENT_OTHER): Payer: Self-pay

## 2015-06-11 DIAGNOSIS — S51811A Laceration without foreign body of right forearm, initial encounter: Secondary | ICD-10-CM | POA: Insufficient documentation

## 2015-06-11 DIAGNOSIS — S5011XA Contusion of right forearm, initial encounter: Secondary | ICD-10-CM | POA: Diagnosis not present

## 2015-06-11 DIAGNOSIS — F17210 Nicotine dependence, cigarettes, uncomplicated: Secondary | ICD-10-CM | POA: Diagnosis not present

## 2015-06-11 DIAGNOSIS — Y990 Civilian activity done for income or pay: Secondary | ICD-10-CM | POA: Insufficient documentation

## 2015-06-11 DIAGNOSIS — S59911A Unspecified injury of right forearm, initial encounter: Secondary | ICD-10-CM | POA: Diagnosis present

## 2015-06-11 DIAGNOSIS — Y9389 Activity, other specified: Secondary | ICD-10-CM | POA: Insufficient documentation

## 2015-06-11 DIAGNOSIS — Y9289 Other specified places as the place of occurrence of the external cause: Secondary | ICD-10-CM | POA: Insufficient documentation

## 2015-06-11 DIAGNOSIS — Z79899 Other long term (current) drug therapy: Secondary | ICD-10-CM | POA: Diagnosis not present

## 2015-06-11 DIAGNOSIS — W228XXA Striking against or struck by other objects, initial encounter: Secondary | ICD-10-CM | POA: Diagnosis not present

## 2015-06-11 DIAGNOSIS — I10 Essential (primary) hypertension: Secondary | ICD-10-CM | POA: Diagnosis not present

## 2015-06-11 MED ORDER — NAPROXEN 500 MG PO TABS: 500.0000 mg | ORAL_TABLET | Freq: Two times a day (BID) | ORAL | Status: DC

## 2015-06-11 MED ORDER — HYDROCODONE-ACETAMINOPHEN 5-325 MG PO TABS: 1.0000 | ORAL_TABLET | Freq: Four times a day (QID) | ORAL | Status: DC | PRN

## 2015-06-11 MED ORDER — HYDROCODONE-ACETAMINOPHEN 5-325 MG PO TABS
1.0000 | ORAL_TABLET | Freq: Once | ORAL | Status: AC
  Administered 2015-06-11: 1 via ORAL
  Filled 2015-06-11: qty 1

## 2015-06-11 MED FILL — NAPROXEN 500 MG TABLET: 500 | 7 days supply | Qty: 14 | Fill #0

## 2015-06-11 MED FILL — HYDROCODON-APAP 5-325: 5-325 | 2 days supply | Qty: 10 | Fill #0

## 2015-06-11 NOTE — Discharge Instructions (Signed)
Elevate the right arm is much as possible. Take the Naprosyn on a regular basis for the next 7 days. Supplement with hydrocodone as needed. Make an appointment to follow-up with sports medicine if not improving quickly over the next couple days. Return for any new or worse symptoms. Return for numbness in the fingers increased pain with movement of fingers.

## 2015-06-11 NOTE — ED Provider Notes (Signed)
CSN: [id number]     Arrival date & time 06/11/15  1516 History   First MD Initiated Contact with Patient 06/11/15 1536     Chief Complaint  Patient presents with  . Extremity Laceration     (Consider location/radiation/quality/duration/timing/severity/associated sxs/prior Treatment) The history is provided by the patient.  Patient with injury to right forearm while at work. Injury occurred about 2:00 in the afternoon. Patient was hit with a large metal pipe as part of a piston. Nothing broke off no concernn body fragment. No other injuries. Patient states tetanus is up-to-date had one 2 years ago.  Past Medical History  Diagnosis Date  . Hypertension    History reviewed. No pertinent past surgical history. No family history on file. Social History  Substance Use Topics  . Smoking status: Current Some Day Smoker    Types: Cigarettes  . Smokeless tobacco: None  . Alcohol Use: Yes     Comment: occ    Review of Systems  Constitutional: Negative for fever.  HENT: Negative for congestion.   Eyes: Negative for redness.  Respiratory: Negative for shortness of breath.   Cardiovascular: Negative for chest pain and leg swelling.  Gastrointestinal: Negative for abdominal pain.  Musculoskeletal: Negative for back pain and neck pain.  Skin: Positive for wound.  Neurological: Negative for headaches.  Hematological: Does not bruise/bleed easily.  Psychiatric/Behavioral: Negative for confusion.      Allergies  Review of patient's allergies indicates no known allergies.  Home Medications   Prior to Admission medications   Medication Sig Start Date End Date Taking? Authorizing Provider  benazepril-hydrochlorthiazide (LOTENSIN HCT) 20-12.5 MG tablet Take 1 tablet by mouth daily.    Historical Provider, MD  HYDROcodone-acetaminophen (NORCO/VICODIN) 5-325 MG tablet Take 1-2 tablets by mouth every 6 (six) hours as needed for moderate pain. 06/11/15   Vanetta MuldersScott Christene Pounds, MD  naproxen  (NAPROSYN) 500 MG tablet Take 1 tablet (500 mg total) by mouth 2 (two) times daily. 06/11/15   Vanetta MuldersScott Stewart Pimenta, MD   BP 162/116 mmHg  Pulse 115  Temp(Src) 98.4 F (36.9 C) (Oral)  Resp 20  Ht 5\' 8"  (1.727 m)  Wt 123.832 kg  BMI 41.52 kg/m2  SpO2 99% Physical Exam  Constitutional: He is oriented to person, place, and time. He appears well-developed and well-nourished. No distress.  HENT:  Head: Normocephalic and atraumatic.  Mouth/Throat: Oropharynx is clear and moist.  Eyes: Conjunctivae and EOM are normal. Pupils are equal, round, and reactive to light.  Neck: Normal range of motion. Neck supple.  Cardiovascular: Normal rate, regular rhythm and normal heart sounds.   Pulmonary/Chest: Effort normal and breath sounds normal. No respiratory distress.  Abdominal: Soft. Bowel sounds are normal. There is no tenderness.  Musculoskeletal: Normal range of motion. He exhibits tenderness.  Swelling to the proximal right forearm along the radial aspect. Also some mild swelling out laterally along the ulnar aspect more distally with a superficial laceration measuring about 4 cm. Radial pulses 2+ Refill is 1 second. Good range of motion at shoulder elbow wrist and fingers there is some mild discomfort with movement of the fingers and the elbow.  Neurological: He is alert and oriented to person, place, and time. No cranial nerve deficit. He exhibits normal muscle tone. Coordination normal.  Skin: Skin is warm. No rash noted.  Nursing note and vitals reviewed.   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 Pain with laceration lateral to the posterior aspect of the proximal forearm. EXAM: RIGHT FOREARM - 2 VIEW COMPARISON:  None. FINDINGS: Two-view exam shows no fracture. No focal lytic or sclerotic osseous abnormality in the  radius or ulna. No evidence for retained radiopaque soft tissue foreign body. IMPRESSION: Negative. Electronically Signed   By: Kennith Center M.D.   On: 06/11/2015 16:04   I have personally reviewed and evaluated these images and lab results as part of my medical decision-making.   EKG Interpretation None      MDM   Final diagnoses:  Forearm contusion, right, initial encounter    Patient's tetanus is up-to-date. Superficial laceration to the right forearm not requiring any suturing measuring about 4 cm. Swelling to the lateral aspect of the forearm. Due to soft tissue injury from being hit by a large type type piston. No evidence of any bony injuries. No evidence of compartment syndrome at this point in time.    Refill is 1 second. Radial pulses 2+. mild discomfort with range of motion of the fingers and hands.    We'll treat with elevation sling anti-inflammatories and pain medicine follow-up with sports medicine as needed. Precautions about development of compartment syndrome syndrome provided.   Vanetta Mulders, MD 06/11/15 [phone]
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
## 2015-06-17 ENCOUNTER — Encounter: Payer: Self-pay | Admitting: Family Medicine

## 2015-06-17 ENCOUNTER — Telehealth (HOSPITAL_BASED_OUTPATIENT_CLINIC_OR_DEPARTMENT_OTHER): Payer: Self-pay | Admitting: *Deleted

## 2015-06-17 ENCOUNTER — Ambulatory Visit (INDEPENDENT_AMBULATORY_CARE_PROVIDER_SITE_OTHER): Payer: Worker's Compensation | Admitting: Family Medicine

## 2015-06-17 VITALS — BP 146/94 | HR 85 | Ht 68.0 in | Wt 270.0 lb

## 2015-06-17 DIAGNOSIS — S5011XA Contusion of right forearm, initial encounter: Secondary | ICD-10-CM | POA: Diagnosis not present

## 2015-06-17 MED ORDER — NAPROXEN 500 MG PO TABS: 500.0000 mg | ORAL_TABLET | Freq: Two times a day (BID) | ORAL | Status: DC

## 2015-06-17 MED ORDER — HYDROCODONE-ACETAMINOPHEN 5-325 MG PO TABS: 1.0000 | ORAL_TABLET | Freq: Four times a day (QID) | ORAL | Status: DC | PRN

## 2015-06-17 MED FILL — HYDROCODON-APAP 5-325: 5-325 | 8 days supply | Qty: 30 | Fill #0

## 2015-06-17 MED FILL — NAPROXEN 500 MG TABLET: 500 | 30 days supply | Qty: 60 | Fill #0

## 2015-06-17 NOTE — Patient Instructions (Signed)
You have a severe forearm contusion. Ice the area 15 minutes at a time 3-4 times a day (ok to use heat instead if this feels better). Naproxen twice a day with food regularly. norco as needed for severe pain - no driving on this medicine. Compression sleeve for support during the day. Follow up with me in 2 weeks. See work note for restrictions - out of work if nothing available.

## 2015-06-17 NOTE — ED Notes (Signed)
Spoke with Joyce GrossKay from patient's employer, [employer]. They were confirming his return to work date as 06/15/2015.

## 2015-06-18 DIAGNOSIS — S5010XA Contusion of unspecified forearm, initial encounter: Secondary | ICD-10-CM | POA: Insufficient documentation

## 2015-06-18 NOTE — Assessment & Plan Note (Signed)
Right forearm contusion - independently reviewed radiographs and no evidence fracture.  No abnormalities noted on bedside MSK u/s either.  Reassuring.  Will continue with icing, naproxen with norco as needed.  Compression sleeve.  F/u in 2 weeks.  Placed on light duty until follow-up.

## 2015-06-18 NOTE — Progress Notes (Signed)
PCP: Dan MakerR,RICHARD L, MD  Subjective:   HPI: Patient is a 45 y.o. male here for right forearm injury.  Patient reports he was at work on 4/18 when a hydraulic arm kicked out and struck him on ulnar aspect of right forearm. Immediate pain, difficulty moving arm at elbow and wrist due to pain. + swelling. Placed in sling, taking naprosyn and norco with mild benefit. No prior injuries. Pain level is now 6/10, sharp Icing the area as well. No skin changes, numbness.  Past Medical History  Diagnosis Date  . Hypertension     Current Outpatient Prescriptions on File Prior to Visit  Medication Sig Dispense Refill  . benazepril-hydrochlorthiazide (LOTENSIN HCT) 20-12.5 MG tablet Take 1 tablet by mouth daily.     No current facility-administered medications on file prior to visit.    No past surgical history on file.  Allergies  Allergen Reactions  . Promethazine Palpitations    Social History   Social History  . Marital Status: Married    Spouse Name: N/A  . Number of Children: N/A  . Years of Education: N/A   Occupational History  . Not on file.   Social History Main Topics  . Smoking status: Current Some Day Smoker    Types: Cigarettes  . Smokeless tobacco: Not on file  . Alcohol Use: 0.0 oz/week    0 Standard drinks or equivalent per week     Comment: occ  . Drug Use: No  . Sexual Activity: Not on file   Other Topics Concern  . Not on file   Social History Narrative    No family history on file.  BP 146/94 mmHg  Pulse 85  Ht 5\' 8"  (1.727 m)  Wt 270 lb (122.471 kg)  BMI 41.06 kg/m2  Review of Systems: See HPI above.    Objective:  Physical Exam:  Gen: NAD, comfortable in exam room  Right arm: Mild swelling, small healing superficial laceration dorsal forearm.  No bruising, other deformity. TTP dorsal forearm extensor musculature more than flexors and along med-prox ulna > radius. FROM elbow and wrist - pain with elbow extension, wrist extension  > flexion, 3rd digit extension, supination. Collateral ligaments intact of elbow. NVI distally.  Left arm: FROM elbow and wrist without pain.    Assessment & Plan:  1. Right forearm contusion - independently reviewed radiographs and no evidence fracture.  No abnormalities noted on bedside MSK u/s either.  Reassuring.  Will continue with icing, naproxen with norco as needed.  Compression sleeve.  F/u in 2 weeks.  Placed on light duty until follow-up.

## 2015-06-28 ENCOUNTER — Ambulatory Visit (INDEPENDENT_AMBULATORY_CARE_PROVIDER_SITE_OTHER): Payer: Worker's Compensation | Admitting: Family Medicine

## 2015-06-28 ENCOUNTER — Encounter: Payer: Self-pay | Admitting: Family Medicine

## 2015-06-28 VITALS — BP 155/95 | HR 102 | Ht 68.0 in | Wt 270.0 lb

## 2015-06-28 DIAGNOSIS — S5011XD Contusion of right forearm, subsequent encounter: Secondary | ICD-10-CM

## 2015-06-28 DIAGNOSIS — M25511 Pain in right shoulder: Secondary | ICD-10-CM

## 2015-06-28 MED ORDER — METHYLPREDNISOLONE ACETATE 40 MG/ML IJ SUSP
40.0000 mg | Freq: Once | INTRAMUSCULAR | Status: AC
  Administered 2015-06-28: 40 mg via INTRA_ARTICULAR

## 2015-06-28 NOTE — Progress Notes (Signed)
PCP: Dan MakerR,RICHARD L, MD  Subjective:   HPI: Patient is a 45 y.o. male here for right forearm injury.  4/24: Patient reports he was at work on 4/18 when a hydraulic arm kicked out and struck him on ulnar aspect of right forearm. Immediate pain, difficulty moving arm at elbow and wrist due to pain. + swelling. Placed in sling, taking naprosyn and norco with mild benefit. No prior injuries. Pain level is now 6/10, sharp Icing the area as well. No skin changes, numbness.  5/5: Patient reports forearm is improving. No pain currently - just a soreness lateral forearm. Bothers some when bending wrist but is tolerable. Unfortunately has been using right shoulder more and now this is painful. Worse with overhead motions, reaching. Taking naproxen. Pain level 3/10, sharp with the above motions. No prior issues with this shoulder. No skin changes, radiation, numbness. Past Medical History  Diagnosis Date  . Hypertension     Current Outpatient Prescriptions on File Prior to Visit  Medication Sig Dispense Refill  . benazepril-hydrochlorthiazide (LOTENSIN HCT) 20-12.5 MG tablet Take 1 tablet by mouth daily.    Marland Kitchen. HYDROcodone-acetaminophen (NORCO/VICODIN) 5-325 MG tablet Take 1 tablet by mouth every 6 (six) hours as needed for moderate pain. 30 tablet 0  . naproxen (NAPROSYN) 500 MG tablet Take 1 tablet (500 mg total) by mouth 2 (two) times daily. 60 tablet 1   No current facility-administered medications on file prior to visit.    No past surgical history on file.  Allergies  Allergen Reactions  . Promethazine Palpitations    Social History   Social History  . Marital Status: Married    Spouse Name: N/A  . Number of Children: N/A  . Years of Education: N/A   Occupational History  . Not on file.   Social History Main Topics  . Smoking status: Current Some Day Smoker    Types: Cigarettes  . Smokeless tobacco: Not on file  . Alcohol Use: 0.0 oz/week    0 Standard drinks  or equivalent per week     Comment: occ  . Drug Use: No  . Sexual Activity: Not on file   Other Topics Concern  . Not on file   Social History Narrative    No family history on file.  BP 155/95 mmHg  Pulse 102  Ht 5\' 8"  (1.727 m)  Wt 270 lb (122.471 kg)  BMI 41.06 kg/m2  Review of Systems: See HPI above.    Objective:  Physical Exam:  Gen: NAD, comfortable in exam room  Right arm: Well healing laceration.  No swelling, bruising, other deformity. Minimal TTP extensors of forearm now.  No other tenderness. FROM elbow and wrist - minimal pain wrist extension only.   Collateral ligaments intact of elbow. NVI distally.  Left arm: FROM elbow and wrist without pain.    Right shoulder: No swelling, ecchymoses.  No gross deformity. No TTP. FROM with painful arc. Positive Hawkins, Neers. Negative Speeds, Yergasons. Strength 5/5 with empty can and resisted internal/external rotation.  Pain empty can. Negative apprehension. NV intact distally.  Assessment & Plan:  1. Right forearm contusion - Much improved with only mild pain now.  Continue icing, naproxen as needed.    2. Right shoulder pain - overusing due to forearm injury.  Consistent with rotator cuff impingement.  Discussed options - went ahead with subacromial injection today.  Shown home exercises to do daily.  F/u in 4 weeks.  Continue naproxen.  Light duty note written -  out for 1 week first following injection.  After informed written consent, patient was seated on exam table. Right shoulder was prepped with alcohol swab and utilizing posterior approach, patient's right subacromial space was injected with 3:1 marcaine: depomedrol. Patient tolerated the procedure well without immediate complications.

## 2015-06-28 NOTE — Assessment & Plan Note (Signed)
overusing due to forearm injury.  Consistent with rotator cuff impingement.  Discussed options - went ahead with subacromial injection today.  Shown home exercises to do daily.  F/u in 4 weeks.  Continue naproxen.  Light duty note written - out for 1 week first following injection.  After informed written consent, patient was seated on exam table. Right shoulder was prepped with alcohol swab and utilizing posterior approach, patient's right subacromial space was injected with 3:1 marcaine: depomedrol. Patient tolerated the procedure well without immediate complications.

## 2015-06-28 NOTE — Assessment & Plan Note (Signed)
Much improved with only mild pain now.  Continue icing, naproxen as needed.

## 2015-06-28 NOTE — Patient Instructions (Addendum)
Your forearm is healing well. You have rotator cuff impingement secondary to the forearm injury from overuse. Try to avoid painful activities (overhead activities, lifting with extended arm) as much as possible. Naproxen twice a day with food for pain and inflammation. Can take tylenol in addition to this. Subacromial injection may be beneficial to help with pain and to decrease inflammation - you were given this today. Consider physical therapy with transition to home exercise program. Do home exercise program with theraband and scapular stabilization exercises daily - these are very important for long term relief even if an injection was given. If not improving at follow-up we will consider further imaging, physical therapy, and/or nitro patches. Follow up with me in 4 weeks.

## 2015-07-04 ENCOUNTER — Telehealth: Payer: Self-pay | Admitting: Family Medicine

## 2015-07-08 NOTE — Telephone Encounter (Signed)
Unable to contact patient.    Will try again.

## 2015-07-08 NOTE — Telephone Encounter (Signed)
We reviewed on 5/5 that my recommendation was out of work until 5/15 then light duty until follow-up in 4 weeks and out of work if they don't have light duty.  If he's adamant about returning to work sooner it's probably best if I see him this week to reexamine his shoulder.

## 2015-07-26 ENCOUNTER — Encounter: Payer: Self-pay | Admitting: Family Medicine

## 2015-07-26 ENCOUNTER — Ambulatory Visit (INDEPENDENT_AMBULATORY_CARE_PROVIDER_SITE_OTHER): Payer: Worker's Compensation | Admitting: Family Medicine

## 2015-07-26 VITALS — BP 163/108 | HR 108 | Ht 68.0 in | Wt 265.0 lb

## 2015-07-26 DIAGNOSIS — M25511 Pain in right shoulder: Secondary | ICD-10-CM

## 2015-07-26 DIAGNOSIS — S5011XD Contusion of right forearm, subsequent encounter: Secondary | ICD-10-CM

## 2015-07-26 NOTE — Patient Instructions (Signed)
Return to full duty. Follow up with me in 4-6 weeks for likely final visit.

## 2015-07-29 NOTE — Progress Notes (Signed)
PCP: Dan Maker, MD  Subjective:   HPI: Patient is a 45 y.o. male here for right forearm injury.  4/24: Patient reports he was at work on 4/18 when a hydraulic arm kicked out and struck him on ulnar aspect of right forearm. Immediate pain, difficulty moving arm at elbow and wrist due to pain. + swelling. Placed in sling, taking naprosyn and norco with mild benefit. No prior injuries. Pain level is now 6/10, sharp Icing the area as well. No skin changes, numbness.  5/5: Patient reports forearm is improving. No pain currently - just a soreness lateral forearm. Bothers some when bending wrist but is tolerable. Unfortunately has been using right shoulder more and now this is painful. Worse with overhead motions, reaching. Taking naproxen. Pain level 3/10, sharp with the above motions. No prior issues with this shoulder. No skin changes, radiation, numbness.  6/2: Patient reports he feels much better. Has been out of work as light duty not available. Doing home exercises. No longer needing any medication. Pain 0/10. No skin changes, numbness.  Past Medical History  Diagnosis Date  . Hypertension     Current Outpatient Prescriptions on File Prior to Visit  Medication Sig Dispense Refill  . benazepril-hydrochlorthiazide (LOTENSIN HCT) 20-12.5 MG tablet Take 1 tablet by mouth daily.    Marland Kitchen HYDROcodone-acetaminophen (NORCO/VICODIN) 5-325 MG tablet Take 1 tablet by mouth every 6 (six) hours as needed for moderate pain. 30 tablet 0  . naproxen (NAPROSYN) 500 MG tablet Take 1 tablet (500 mg total) by mouth 2 (two) times daily. 60 tablet 1   No current facility-administered medications on file prior to visit.    No past surgical history on file.  Allergies  Allergen Reactions  . Promethazine Palpitations    Social History   Social History  . Marital Status: Married    Spouse Name: N/A  . Number of Children: N/A  . Years of Education: N/A   Occupational History  .  Not on file.   Social History Main Topics  . Smoking status: Current Some Day Smoker    Types: Cigarettes  . Smokeless tobacco: Not on file  . Alcohol Use: 0.0 oz/week    0 Standard drinks or equivalent per week     Comment: occ  . Drug Use: No  . Sexual Activity: Not on file   Other Topics Concern  . Not on file   Social History Narrative    No family history on file.  BP 163/108 mmHg  Pulse 108  Ht  (1.727 m)  Wt 265 lb (120.203 kg)  BMI 40.30 kg/m2  Review of Systems: See HPI above.    Objective:  Physical Exam:  Gen: NAD, comfortable in exam room  Right arm: Well healing laceration.  No swelling, bruising, other deformity. No TTP extensors of forearm now.  No other tenderness. FROM elbow and wrist without pain.   Collateral ligaments intact of elbow. NVI distally.  Left arm: FROM elbow and wrist without pain.    Right shoulder: No swelling, ecchymoses.  No gross deformity. No TTP. FROM with negative painful arc. Negative Hawkins, Neers. Negative Speeds, Yergasons. Strength 5/5 with empty can and resisted internal/external rotation.  Minimal pain empty can. Negative apprehension. NV intact distally.  Assessment & Plan:  1. Right forearm contusion - Much improved.  Icing, naproxen only if needed.  2. Right shoulder pain - overusing due to forearm injury.  Consistent with rotator cuff impingement.  Improved with injection, home exercises, finished  with naproxen.  Return to full duty.  F/u in 4-6 weeks for likely final visit.

## 2015-07-29 NOTE — Assessment & Plan Note (Signed)
overusing due to forearm injury.  Consistent with rotator cuff impingement.  Improved with injection, home exercises, finished with naproxen.  Return to full duty.  F/u in 4-6 weeks for likely final visit.

## 2015-07-29 NOTE — Assessment & Plan Note (Signed)
Much improved.  Icing, naproxen only if needed.

## 2015-08-07 ENCOUNTER — Telehealth: Payer: Self-pay | Admitting: Family Medicine

## 2015-08-07 NOTE — Telephone Encounter (Signed)
Unable to contact. Will try again. 

## 2015-08-07 NOTE — Telephone Encounter (Signed)
We received the denial for his workers comp for the shoulder - we are in process of fixing that so it's covered by workers comp with the forearm as primary diagnosis.

## 2015-08-08 NOTE — Telephone Encounter (Signed)
Spoke to spouse and gave her information provided.

## 2015-08-30 ENCOUNTER — Ambulatory Visit (INDEPENDENT_AMBULATORY_CARE_PROVIDER_SITE_OTHER): Payer: Worker's Compensation | Admitting: Family Medicine

## 2015-08-30 ENCOUNTER — Encounter: Payer: Self-pay | Admitting: Family Medicine

## 2015-08-30 VITALS — BP 180/120 | Ht 68.0 in | Wt 265.0 lb

## 2015-08-30 DIAGNOSIS — S5011XD Contusion of right forearm, subsequent encounter: Secondary | ICD-10-CM | POA: Diagnosis not present

## 2015-08-30 NOTE — Patient Instructions (Signed)
You are fully cleared - we will release you from care at this time.

## 2015-09-02 ENCOUNTER — Encounter: Payer: Self-pay | Admitting: Family Medicine

## 2015-09-02 NOTE — Progress Notes (Signed)
PCP: Dan MakerR,RICHARD L, MD  Subjective:   HPI: Patient is a 45 y.o. male here for right forearm injury.  4/24: Patient reports he was at work on 4/18 when a hydraulic arm kicked out and struck him on ulnar aspect of right forearm. Immediate pain, difficulty moving arm at elbow and wrist due to pain. + swelling. Placed in sling, taking naprosyn and norco with mild benefit. No prior injuries. Pain level is now 6/10, sharp Icing the area as well. No skin changes, numbness.  5/5: Patient reports forearm is improving. No pain currently - just a soreness lateral forearm. Bothers some when bending wrist but is tolerable. Unfortunately has been using right shoulder more and now this is painful. Worse with overhead motions, reaching. Taking naproxen. Pain level 3/10, sharp with the above motions. No prior issues with this shoulder. No skin changes, radiation, numbness.  6/2: Patient reports he feels much better. Has been out of work as light duty not available. Doing home exercises. No longer needing any medication. Pain 0/10. No skin changes, numbness.  7/7: Patient reports he is back to full duty now without any problems. No complaints. Pain 0/10. No skin changes, numbness.  Past Medical History  Diagnosis Date  . Hypertension     Current Outpatient Prescriptions on File Prior to Visit  Medication Sig Dispense Refill  . benazepril-hydrochlorthiazide (LOTENSIN HCT) 20-12.5 MG tablet Take 1 tablet by mouth daily.    Marland Kitchen. HYDROcodone-acetaminophen (NORCO/VICODIN) 5-325 MG tablet Take 1 tablet by mouth every 6 (six) hours as needed for moderate pain. 30 tablet 0  . naproxen (NAPROSYN) 500 MG tablet Take 1 tablet (500 mg total) by mouth 2 (two) times daily. 60 tablet 1   No current facility-administered medications on file prior to visit.    No past surgical history on file.  Allergies  Allergen Reactions  . Promethazine Palpitations    Social History   Social History   . Marital Status: Married    Spouse Name: N/A  . Number of Children: N/A  . Years of Education: N/A   Occupational History  . Not on file.   Social History Main Topics  . Smoking status: Current Some Day Smoker    Types: Cigarettes  . Smokeless tobacco: Not on file  . Alcohol Use: 0.0 oz/week    0 Standard drinks or equivalent per week     Comment: occ  . Drug Use: No  . Sexual Activity: Not on file   Other Topics Concern  . Not on file   Social History Narrative    No family history on file.  BP 180/120 mmHg  Ht 5\' 8"  (1.727 m)  Wt 265 lb (120.203 kg)  BMI 40.30 kg/m2  Review of Systems: See HPI above.    Objective:  Physical Exam:  Gen: NAD, comfortable in exam room  Right arm: Well healed scar where had superficial laceration.  No swelling, bruising, other deformity. No TTP extensors of forearm now.  No other tenderness. FROM elbow and wrist without pain.   Collateral ligaments intact of elbow. NVI distally.  Left arm: FROM elbow and wrist without pain.  Assessment & Plan:  1. Right forearm contusion - Much improved.  Icing, naproxen only if needed.  Back at full duty without problems.

## 2015-09-02 NOTE — Assessment & Plan Note (Signed)
Much improved.  Icing, naproxen only if needed.  Back at full duty without problems.

## 2016-02-14 ENCOUNTER — Encounter (HOSPITAL_BASED_OUTPATIENT_CLINIC_OR_DEPARTMENT_OTHER): Payer: Self-pay | Admitting: *Deleted

## 2016-02-14 ENCOUNTER — Emergency Department (HOSPITAL_BASED_OUTPATIENT_CLINIC_OR_DEPARTMENT_OTHER)
Admission: EM | Admit: 2016-02-14 | Discharge: 2016-02-14 | Disposition: A | Discharge status: Home / Self Care | Payer: BLUE CROSS/BLUE SHIELD | Attending: Emergency Medicine | Admitting: Emergency Medicine

## 2016-02-14 DIAGNOSIS — Z791 Long term (current) use of non-steroidal anti-inflammatories (NSAID): Secondary | ICD-10-CM | POA: Insufficient documentation

## 2016-02-14 DIAGNOSIS — Z79899 Other long term (current) drug therapy: Secondary | ICD-10-CM | POA: Insufficient documentation

## 2016-02-14 DIAGNOSIS — M10072 Idiopathic gout, left ankle and foot: Secondary | ICD-10-CM

## 2016-02-14 DIAGNOSIS — F17210 Nicotine dependence, cigarettes, uncomplicated: Secondary | ICD-10-CM | POA: Insufficient documentation

## 2016-02-14 DIAGNOSIS — I10 Essential (primary) hypertension: Secondary | ICD-10-CM | POA: Insufficient documentation

## 2016-02-14 HISTORY — DX: Gout, unspecified: M10.9

## 2016-02-14 MED ORDER — PREDNISONE 10 MG PO TABS: ORAL_TABLET | ORAL | 0 refills | Status: DC

## 2016-02-14 MED ORDER — HYDROCODONE-ACETAMINOPHEN 5-325 MG PO TABS: 2.0000 | ORAL_TABLET | ORAL | 0 refills | Status: DC | PRN

## 2016-02-14 MED ORDER — HYDROCODONE-ACETAMINOPHEN 5-325 MG PO TABS
2.0000 | ORAL_TABLET | Freq: Once | ORAL | Status: AC
  Administered 2016-02-14: 2 via ORAL
  Filled 2016-02-14: qty 2

## 2016-02-14 NOTE — Discharge Instructions (Signed)
See your Physician for recheck next wek

## 2016-02-14 NOTE — ED Provider Notes (Signed)
[facility] Note   CSN: [id number] Arrival date & time: 02/14/16  1625 By signing my name below, I, Ricardo Heath, attest that this documentation has been prepared under the direction and in the presence of Ricardo Heath, [place]C. Electronically Signed: Bridgette HabermannMaria Heath, ED Scribe. 02/14/16. 6:04 PM.  History   Chief Complaint Chief Complaint  Patient presents with  . Gout   HPI Comments: Ricardo DecantRobert Jasmer is a 45 y.o. male with h/o gout and HTN, who presents to the Emergency Department complaining of 10/10 left foot pain onset 3 weeks ago, worsening a couple days ago. Pain is exacerbated with movement and bearing weight. Pt notes h/o gout and states his pain at this time is consistent with his gout exacerbations. He is prescribed Allopurinol for his gout and states it seemed to make his symptoms worse. Pt has also taken ibuprofen with no relief. Pt notes that he once took prednisone for gout and reports it had improved his pain significantly. Pt denies fever, chills, or any other associated symptoms.   PCP: Ricardo Heath, R  The history is provided by the patient. No language interpreter was used.    Past Medical History:  Diagnosis Date  . Gout   . Hypertension     Patient Active Problem List   Diagnosis Date Noted  . Right shoulder pain 06/28/2015  . Forearm contusion 06/18/2015    History reviewed. No pertinent surgical history.     Home Medications    Prior to Admission medications   Medication Sig Start Date End Date Taking? Authorizing Provider  allopurinol (ZYLOPRIM) 100 MG tablet Take 100 mg by mouth 3 (three) times daily.   Yes Historical Provider, MD  benazepril (LOTENSIN) 20 MG tablet  07/29/15   Historical Provider, MD  benazepril (LOTENSIN) 20 MG tablet TK 2 TS PO D 07/29/15   Historical Provider, MD  benazepril-hydrochlorthiazide (LOTENSIN HCT) 20-12.5 MG tablet Take 1 tablet by mouth daily.    Historical Provider, MD  HYDROcodone-acetaminophen (NORCO/VICODIN) 5-325  MG tablet Take 1 tablet by mouth every 6 (six) hours as needed for moderate pain. 06/17/15   Ricardo KelpShane R Hudnall, MD  naproxen (NAPROSYN) 500 MG tablet Take 1 tablet (500 mg total) by mouth 2 (two) times daily. 06/17/15   Ricardo KelpShane R Hudnall, MD    Family History History reviewed. No pertinent family history.  Social History Social History  Substance Use Topics  . Smoking status: Current Some Day Smoker    Types: Cigarettes  . Smokeless tobacco: Not on file  . Alcohol use 0.0 oz/week     Comment: occ     Allergies   Promethazine   Review of Systems Review of Systems  Constitutional: Negative for chills and fever.  Musculoskeletal: Positive for arthralgias and myalgias.  All other systems reviewed and are negative.    Physical Exam Updated Vital Signs BP 125/83   Pulse 96   Temp 98.5 F (36.9 C)   Resp 16   Ht 5\' 6"  (1.676 m)   Wt 256 lb (116.1 kg)   SpO2 100%   BMI 41.32 kg/m   Physical Exam  Constitutional: He appears well-developed and well-nourished.  HENT:  Head: Normocephalic.  Eyes: Conjunctivae are normal.  Cardiovascular: Normal rate.   Pulmonary/Chest: Effort normal. No respiratory distress.  Abdominal: He exhibits no distension.  Musculoskeletal: Normal range of motion. He exhibits edema.  Swollen left first toe.   Neurological: He is alert.  Skin: Skin is warm and dry.  Psychiatric: He has  a normal mood and affect. His behavior is normal.  Nursing note and vitals reviewed.    ED Treatments / Results  DIAGNOSTIC STUDIES: Oxygen Saturation is 100% on RA, normal by my interpretation.    COORDINATION OF CARE: 6:04 PM Discussed treatment plan with pt at bedside and pt agreed to plan.  Labs (all labs ordered are listed, but only abnormal results are displayed) Labs Reviewed - No data to display  EKG  EKG Interpretation None       Radiology No results found.  Procedures Procedures (including critical care time)  Medications Ordered in  ED Medications - No data to display   Initial Impression / Assessment and Plan / ED Course  I have reviewed the triage vital signs and the nursing notes.  Pertinent labs & imaging results that were available during my care of the patient were reviewed by me and considered in my medical decision making (see chart for details).  Clinical Course       Final Clinical Impressions(s) / ED Diagnoses   Final diagnoses:  Acute idiopathic gout involving toe of left foot    New Prescriptions New Prescriptions   No medications on file   Meds ordered this encounter  Medications  . allopurinol (ZYLOPRIM) 100 MG tablet    Sig: Take 100 mg by mouth 3 (three) times daily.  . predniSONE (DELTASONE) 10 MG tablet    Sig: 6,5,4,3,2,1 taper    Dispense:  21 tablet    Refill:  0    Order Specific Question:   Supervising Provider    Answer:   Cathren LaineSTEINL, KEVIN [[id number]]  . HYDROcodone-acetaminophen (NORCO/VICODIN) 5-325 MG tablet    Sig: Take 2 tablets by mouth every 4 (four) hours as needed.    Dispense:  20 tablet    Refill:  0    Order Specific Question:   Supervising Provider    Answer:   Cathren LaineSTEINL, KEVIN [[id number]]  . HYDROcodone-acetaminophen (NORCO/VICODIN) 5-325 MG per tablet 2 tablet  An After Visit Summary was printed and given to the patient. I personally performed the services in this documentation, which was scribed in my presence.  The recorded information has been reviewed and considered.   Ricardo PallKaren SofiaPAC.    Lonia SkinnerLeslie K DellekerSofia, PA-C 02/14/16 2033    Pricilla LovelessScott Goldston, MD 02/16/16 [phone]

## 2016-02-14 NOTE — ED Triage Notes (Signed)
Pt c/o gout to left foot x 3 weeks

## 2017-02-08 ENCOUNTER — Encounter (HOSPITAL_BASED_OUTPATIENT_CLINIC_OR_DEPARTMENT_OTHER): Payer: Self-pay

## 2017-02-08 ENCOUNTER — Emergency Department (HOSPITAL_BASED_OUTPATIENT_CLINIC_OR_DEPARTMENT_OTHER): Payer: Worker's Compensation

## 2017-02-08 ENCOUNTER — Other Ambulatory Visit: Payer: Self-pay

## 2017-02-08 DIAGNOSIS — Y9389 Activity, other specified: Secondary | ICD-10-CM | POA: Insufficient documentation

## 2017-02-08 DIAGNOSIS — X509XXA Other and unspecified overexertion or strenuous movements or postures, initial encounter: Secondary | ICD-10-CM | POA: Insufficient documentation

## 2017-02-08 DIAGNOSIS — Y999 Unspecified external cause status: Secondary | ICD-10-CM | POA: Insufficient documentation

## 2017-02-08 DIAGNOSIS — I10 Essential (primary) hypertension: Secondary | ICD-10-CM | POA: Insufficient documentation

## 2017-02-08 DIAGNOSIS — Z79899 Other long term (current) drug therapy: Secondary | ICD-10-CM | POA: Insufficient documentation

## 2017-02-08 DIAGNOSIS — S63501A Unspecified sprain of right wrist, initial encounter: Secondary | ICD-10-CM | POA: Insufficient documentation

## 2017-02-08 DIAGNOSIS — F17210 Nicotine dependence, cigarettes, uncomplicated: Secondary | ICD-10-CM | POA: Insufficient documentation

## 2017-02-08 DIAGNOSIS — Y929 Unspecified place or not applicable: Secondary | ICD-10-CM | POA: Insufficient documentation

## 2017-02-08 NOTE — ED Triage Notes (Signed)
Pt strained right hand and wrist when he was getting out of a truck at work tonight, is unsure if he needs UDS or not

## 2017-02-09 ENCOUNTER — Emergency Department (HOSPITAL_BASED_OUTPATIENT_CLINIC_OR_DEPARTMENT_OTHER)
Admission: EM | Admit: 2017-02-09 | Discharge: 2017-02-09 | Disposition: A | Discharge status: Home / Self Care | Payer: Worker's Compensation | Attending: Emergency Medicine | Admitting: Emergency Medicine

## 2017-02-09 DIAGNOSIS — S63501A Unspecified sprain of right wrist, initial encounter: Secondary | ICD-10-CM

## 2017-02-09 MED ORDER — IBUPROFEN 800 MG PO TABS: 800.0000 mg | ORAL_TABLET | Freq: Once | ORAL | Status: DC

## 2017-02-09 MED ORDER — IBUPROFEN 800 MG PO TABS
ORAL_TABLET | ORAL | Status: AC
  Filled 2017-02-09: qty 1

## 2017-02-09 MED ORDER — IBUPROFEN 800 MG PO TABS: 800.0000 mg | ORAL_TABLET | Freq: Three times a day (TID) | ORAL | 0 refills | Status: DC | PRN

## 2017-02-09 NOTE — Discharge Instructions (Signed)
You may alternate Tylenol 1000 mg every 6 hours as needed for pain and Ibuprofen 800 mg every 8 hours as needed for pain.  Please take Ibuprofen with food. ° °

## 2017-02-09 NOTE — ED Provider Notes (Signed)
TIME SEEN: 12:55 AM  CHIEF COMPLAINT: Right hand and wrist pain  HPI: Patient is a 46 year old male with history of gout and hypertension who is right-hand-dominant who presents emergency department with right wrist and hand pain.  States that he fell getting out of a truck tonight and caught himself with his right wrist and hand and feels like he strained it.  Did not hit his head or lose consciousness.  No neck or back pain.  No other injury.  No numbness or tingling or focal weakness.  Pain worse with movement of the right wrist.  ROS: See HPI Constitutional: no fever  Eyes: no drainage  ENT: no runny nose   Cardiovascular:  no chest pain  Resp: no SOB  GI: no vomiting GU: no dysuria Integumentary: no rash  Allergy: no hives  Musculoskeletal: no leg swelling  Neurological: no slurred speech ROS otherwise negative  PAST MEDICAL HISTORY/PAST SURGICAL HISTORY:  Past Medical History:  Diagnosis Date  . Gout   . Hypertension     MEDICATIONS:  Prior to Admission medications   Medication Sig Start Date End Date Taking? Authorizing Provider  allopurinol (ZYLOPRIM) 100 MG tablet Take 100 mg by mouth 3 (three) times daily.    [provider]  benazepril (LOTENSIN) 20 MG tablet  07/29/15   [provider]  benazepril (LOTENSIN) 20 MG tablet TK 2 TS PO D 07/29/15   [provider]  benazepril-hydrochlorthiazide (LOTENSIN HCT) 20-12.5 MG tablet Take 1 tablet by mouth daily.    [provider]  HYDROcodone-acetaminophen (NORCO/VICODIN) 5-325 MG tablet Take 2 tablets by mouth every 4 (four) hours as needed. 02/14/16   Elson AreasSofia, Leslie K, PA-C  ibuprofen (ADVIL,MOTRIN) 800 MG tablet Take 1 tablet (800 mg total) by mouth every 8 (eight) hours as needed for mild pain. 02/09/17   Tiffanny Lamarche, Layla MawKristen N, DO  naproxen (NAPROSYN) 500 MG tablet Take 1 tablet (500 mg total) by mouth 2 (two) times daily. 06/17/15   Lenda KelpHudnall, Shane R, MD  predniSONE (DELTASONE) 10 MG tablet  6,5,4,3,2,1 taper 02/14/16   Elson AreasSofia, Leslie K, PA-C    ALLERGIES:  Allergies  Allergen Reactions  . Promethazine Palpitations    SOCIAL HISTORY:  Social History   Tobacco Use  . Smoking status: Current Some Day Smoker    Types: Cigarettes  Substance Use Topics  . Alcohol use: Yes    Alcohol/week: 0.0 oz    Comment: occ    FAMILY HISTORY: No family history on file.  EXAM: BP (!) 134/105 (BP Location: Left Arm)   Pulse 99   Temp 98.5 F (36.9 C) (Oral)   Resp 18   Ht 5\' 7"  (1.702 m)   Wt 117.9 kg (260 lb)   SpO2 100%   BMI 40.72 kg/m  CONSTITUTIONAL: Alert and oriented and responds appropriately to questions. Well-appearing; well-nourished HEAD: Normocephalic, atraumatic EYES: Conjunctivae clear, pupils appear equal, EOMI ENT: normal nose; moist mucous membranes NECK: Supple, no meningismus, no nuchal rigidity, no LAD  CARD: RRR; S1 and S2 appreciated; no murmurs, no clicks, no rubs, no gallops RESP: Normal chest excursion without splinting or tachypnea; breath sounds clear and equal bilaterally; no wheezes, no rhonchi, no rales, no hypoxia or respiratory distress, speaking full sentences ABD/GI: Normal bowel sounds; non-distended; soft, non-tender, no rebound, no guarding, no peritoneal signs, no hepatosplenomegaly BACK:  The back appears normal and is non-tender to palpation, there is no CVA tenderness EXT: Patient is tender to palpation diffusely throughout the right dorsal wrist  but no specific tenderness over the scaphoid.  There is no ecchymosis or swelling.  No erythema or warmth.  No bony abnormality on exam.  2+ radial pulses bilaterally.  Normal range of motion in all fingers with normal strength.  Normal grip strength.  Normal flexion and extension of the wrist.  No ligamentous laxity appreciated.  Normal sensation diffusely throughout the right arm.  Normal ROM in all joints; otherwise extremities are non-tender to palpation; no edema; normal capillary refill; no  cyanosis, no calf tenderness or swelling    SKIN: Normal color for age and race; warm; no rash NEURO: Moves all extremities equally PSYCH: The patient's mood and manner are appropriate. Grooming and personal hygiene are appropriate.  MEDICAL DECISION MAKING: Patient here with right wrist and hand sprain.  X-ray showed no acute abnormality.  Neurovascularly intact distally.  Placed him in a wrist splint for comfort and recommended alternating Tylenol and Motrin.  Recommended rest, elevation and ice.  We will give him a work note for light duty for the next week.  Have given him outpatient PCP and hand surgery follow-up as needed if symptoms are not improving with medical management and rest.  No other sign of injury on exam.  At this time, I do not feel there is any life-threatening condition present. I have reviewed and discussed all results (EKG, imaging, lab, urine as appropriate) and exam findings with patient/family. I have reviewed nursing notes and appropriate previous records.  I feel the patient is safe to be discharged home without further emergent workup and can continue workup as an outpatient as needed. Discussed usual and customary return precautions. Patient/family verbalize understanding and are comfortable with this plan.  Outpatient follow-up has been provided if needed. All questions have been answered.      Bayler Nehring, Layla MawKristen N, DO 02/09/17 [phone]

## 2017-08-29 ENCOUNTER — Inpatient Hospital Stay (HOSPITAL_BASED_OUTPATIENT_CLINIC_OR_DEPARTMENT_OTHER)
Admission: EM | Admit: 2017-08-29 | Discharge: 2017-09-01 | DRG: 517 | Disposition: A | Discharge status: Home / Self Care | Payer: Self-pay | Attending: Neurosurgery | Admitting: Neurosurgery

## 2017-08-29 ENCOUNTER — Other Ambulatory Visit: Payer: Self-pay

## 2017-08-29 ENCOUNTER — Other Ambulatory Visit (HOSPITAL_COMMUNITY): Payer: Self-pay

## 2017-08-29 ENCOUNTER — Emergency Department (HOSPITAL_COMMUNITY): Payer: Self-pay

## 2017-08-29 ENCOUNTER — Encounter (HOSPITAL_BASED_OUTPATIENT_CLINIC_OR_DEPARTMENT_OTHER): Payer: Self-pay | Admitting: Emergency Medicine

## 2017-08-29 DIAGNOSIS — Z79899 Other long term (current) drug therapy: Secondary | ICD-10-CM

## 2017-08-29 DIAGNOSIS — I10 Essential (primary) hypertension: Secondary | ICD-10-CM | POA: Diagnosis present

## 2017-08-29 DIAGNOSIS — M5126 Other intervertebral disc displacement, lumbar region: Principal | ICD-10-CM | POA: Diagnosis present

## 2017-08-29 DIAGNOSIS — R29898 Other symptoms and signs involving the musculoskeletal system: Secondary | ICD-10-CM

## 2017-08-29 DIAGNOSIS — Z419 Encounter for procedure for purposes other than remedying health state, unspecified: Secondary | ICD-10-CM

## 2017-08-29 DIAGNOSIS — F17210 Nicotine dependence, cigarettes, uncomplicated: Secondary | ICD-10-CM | POA: Diagnosis present

## 2017-08-29 DIAGNOSIS — M5432 Sciatica, left side: Secondary | ICD-10-CM

## 2017-08-29 DIAGNOSIS — M5431 Sciatica, right side: Secondary | ICD-10-CM

## 2017-08-29 MED ORDER — CHLORTHALIDONE 25 MG PO TABS
12.5000 mg | ORAL_TABLET | Freq: Every day | ORAL | Status: DC
  Administered 2017-08-30 – 2017-09-01 (×3): 12.5 mg via ORAL
  Filled 2017-08-29 (×3): qty 1

## 2017-08-29 MED ORDER — CYCLOBENZAPRINE HCL 10 MG PO TABS
10.0000 mg | ORAL_TABLET | Freq: Once | ORAL | Status: AC
  Administered 2017-08-29: 10 mg via ORAL
  Filled 2017-08-29: qty 1

## 2017-08-29 MED ORDER — HYDROMORPHONE HCL 1 MG/ML IJ SOLN
2.0000 mg | Freq: Once | INTRAMUSCULAR | Status: AC
  Administered 2017-08-29: 2 mg via INTRAMUSCULAR
  Filled 2017-08-29: qty 2

## 2017-08-29 MED ORDER — DIAZEPAM 5 MG PO TABS
10.0000 mg | ORAL_TABLET | Freq: Once | ORAL | Status: AC
  Administered 2017-08-29: 10 mg via ORAL
  Filled 2017-08-29: qty 2

## 2017-08-29 MED ORDER — DILTIAZEM HCL ER COATED BEADS 240 MG PO CP24
240.0000 mg | ORAL_CAPSULE | Freq: Every day | ORAL | Status: DC
  Administered 2017-08-30 – 2017-09-01 (×3): 240 mg via ORAL
  Filled 2017-08-29 (×3): qty 1

## 2017-08-29 MED ORDER — BENAZEPRIL-HYDROCHLOROTHIAZIDE 20-12.5 MG PO TABS: 1.0000 | ORAL_TABLET | Freq: Every day | ORAL | Status: DC

## 2017-08-29 MED ORDER — SODIUM CHLORIDE 0.9 % IV SOLN: INTRAVENOUS | Status: DC

## 2017-08-29 MED ORDER — BENAZEPRIL HCL 20 MG PO TABS
40.0000 mg | ORAL_TABLET | Freq: Every day | ORAL | Status: DC
  Administered 2017-08-30 – 2017-09-01 (×3): 40 mg via ORAL
  Filled 2017-08-29: qty 1
  Filled 2017-08-29: qty 2
  Filled 2017-08-29 (×2): qty 1
  Filled 2017-08-29 (×2): qty 2

## 2017-08-29 MED ORDER — PREDNISONE 50 MG PO TABS
60.0000 mg | ORAL_TABLET | Freq: Once | ORAL | Status: AC
  Administered 2017-08-29: 60 mg via ORAL
  Filled 2017-08-29: qty 1

## 2017-08-29 MED ORDER — ATORVASTATIN CALCIUM 10 MG PO TABS
10.0000 mg | ORAL_TABLET | Freq: Every day | ORAL | Status: DC
  Administered 2017-08-30 – 2017-09-01 (×3): 10 mg via ORAL
  Filled 2017-08-29 (×2): qty 1
  Filled 2017-08-29 (×2): qty 0.5
  Filled 2017-08-29: qty 1
  Filled 2017-08-29: qty 0.5

## 2017-08-29 MED ORDER — ALLOPURINOL 100 MG PO TABS: 100.0000 mg | ORAL_TABLET | Freq: Three times a day (TID) | ORAL | Status: DC

## 2017-08-29 MED ORDER — OXYCODONE-ACETAMINOPHEN 5-325 MG PO TABS
2.0000 | ORAL_TABLET | Freq: Once | ORAL | Status: AC
  Administered 2017-08-29: 2 via ORAL
  Filled 2017-08-29: qty 2

## 2017-08-29 NOTE — ED Notes (Signed)
Pt has all belongings. Family present to drive. Directed to remain NPO and [facility]. Pt and family member verbalize understanding

## 2017-08-29 NOTE — ED Provider Notes (Signed)
[facility] EMERGENCY DEPARTMENT Provider Note   CSN: [id number] Arrival date & time: 08/29/17  1227     History   Chief Complaint Chief Complaint  Patient presents with  . Back Pain    HPI Ricardo Heath is a 47 y.o. male.  HPI Patient has had problems with chronic intermittent lower back pain for about a year.  He reports that he has had different anti-inflammatory medications episodically.  4 days ago however something changed dramatically in terms of severity of pain.  He reports he has severe pain in the center lower back that radiates into both buttocks and down the backs of the thighs.  The right leg it radiates all the way to his foot.  Reports his foot has gone numb and he is having trouble walking.  He reports for several days he has been using supports to walk.  Reports this is due to a combination of the pain as well as weakness of the right leg.  Severe pain with any twisting forward flexion.  Only relieving factor is sleeping in a semi-upright position with the leg propped on a pillow.  He reports he has tried meloxicam, ibuprofen and diclofenac in the past.  Usually these relieve pain but this time no anti-inflammatory medication is alleviating pain.  He reports this came on pretty quickly and he has not had time to notify his primary doctor of the severe change. Past Medical History:  Diagnosis Date  . Gout   . Hypertension     Patient Active Problem List   Diagnosis Date Noted  . Right shoulder pain 06/28/2015  . Forearm contusion 06/18/2015    History reviewed. No pertinent surgical history.      Home Medications    Prior to Admission medications   Medication Sig Start Date End Date Taking? Authorizing Provider  atorvastatin (LIPITOR) 10 MG tablet Take 10 mg by mouth daily.   Yes [provider]  benazepril (LOTENSIN) 40 MG tablet Take 40 mg by mouth daily.   Yes [provider]  chlorthalidone (HYGROTON) 25 MG tablet Take 12.5  mg by mouth daily.   Yes [provider]  diltiazem (CARDIZEM CD) 240 MG 24 hr capsule Take 240 mg by mouth daily.   Yes [provider]  ibuprofen (ADVIL,MOTRIN) 800 MG tablet Take 1 tablet (800 mg total) by mouth every 8 (eight) hours as needed for mild pain. 02/09/17  Yes Ward, Layla Maw, DO  allopurinol (ZYLOPRIM) 100 MG tablet Take 100 mg by mouth 3 (three) times daily.    [provider]  benazepril (LOTENSIN) 20 MG tablet  07/29/15   [provider]  benazepril (LOTENSIN) 20 MG tablet TK 2 TS PO D 07/29/15   [provider]  benazepril-hydrochlorthiazide (LOTENSIN HCT) 20-12.5 MG tablet Take 1 tablet by mouth daily.    [provider]  HYDROcodone-acetaminophen (NORCO/VICODIN) 5-325 MG tablet Take 2 tablets by mouth every 4 (four) hours as needed. 02/14/16   Elson Areas, PA-C  naproxen (NAPROSYN) 500 MG tablet Take 1 tablet (500 mg total) by mouth 2 (two) times daily. 06/17/15   [name], MD  predniSONE (DELTASONE) 10 MG tablet 6,5,4,3,2,1 taper 02/14/16   Elson Areas, PA-C    Family History History reviewed. No pertinent family history.  Social History Social History   Tobacco Use  . Smoking status: Current Some Day Smoker    Types: Cigarettes  . Smokeless tobacco: Never Used  Substance Use Topics  .  Alcohol use: Yes    Alcohol/week: 0.0 oz    Comment: occ  . Drug use: No     Allergies   Promethazine   Review of Systems Review of Systems 10 Systems reviewed and are negative for acute change except as noted in the HPI.   Physical Exam Updated Vital Signs BP 119/85 (BP Location: Left Arm)   Pulse 90   Temp 98.5 F (36.9 C) (Oral)   Resp (!) 22   Ht 5\' 7"  (1.702 m)   Wt 114.8 kg (253 lb)   SpO2 100%   BMI 39.63 kg/m   Physical Exam  Constitutional: He is oriented to person, place, and time. He appears well-developed and well-nourished.  She is alert and appropriate.  No respiratory distress.   Mental status clear.  He does grimace with pain in certain movements.  HENT:  Head: Normocephalic and atraumatic.  Eyes: EOM are normal.  Neck: Neck supple.  Cardiovascular: Normal rate, regular rhythm, normal heart sounds and intact distal pulses.  Pulmonary/Chest: Effort normal and breath sounds normal.  Abdominal: Soft. Bowel sounds are normal. He exhibits no distension. There is no tenderness. There is no guarding.  Musculoskeletal: Normal range of motion. He exhibits tenderness. He exhibits no edema.  Patient does not have midline tenderness or soft tissue tenderness to palpation of the back.  He does endorse that the location of pain seems to be at about the L4-5 as I palpate.  Pain is precipitated by attempting any forward flexion or twisting motion.  Reports that then radiates down both legs.  Other right straight leg raise at 35 degrees.  Patient does have intact plantar and dorsiflexion.  This does seem diminished on the right but also is associated with significant increase in pain.  Patient can elevate the right lower extremity off of the bed independently but slowly.  Reports her right foot feels numb to the touch.  Neurological: He is alert and oriented to person, place, and time. He has normal strength. Coordination normal. GCS eye subscore is 4. GCS verbal subscore is 5. GCS motor subscore is 6.  Skin: Skin is warm, dry and intact.  Psychiatric: He has a normal mood and affect.     ED Treatments / Results  Labs (all labs ordered are listed, but only abnormal results are displayed) Labs Reviewed - No data to display  EKG None  Radiology No results found.  Procedures Procedures (including critical care time)  Medications Ordered in ED Medications  oxyCODONE-acetaminophen (PERCOCET/ROXICET) 5-325 MG per tablet 2 tablet (has no administration in time range)  predniSONE (DELTASONE) tablet 60 mg (has no administration in time range)  cyclobenzaprine (FLEXERIL) tablet 10 mg  (has no administration in time range)     Initial Impression / Assessment and Plan / ED Course  I have reviewed the triage vital signs and the nursing notes.  Pertinent labs & imaging results that were available during my care of the patient were reviewed by me and considered in my medical decision making (see chart for details).     Consult: Dr. Hipolito Bayley at [facility] emergency department made aware of transfer for MRI.  Final Clinical Impressions(s) / ED Diagnoses   Final diagnoses:  Weakness of right lower extremity  Bilateral sciatica   Has history of back pain but has had a acute and severe change in the past 4 days.  He now has weakness and numbness of the right lower extremity.  plan will be for MRI  of the lumbar spine to evaluate for suspected disc herniation with nerve compression.  Patient treated for pain prior to transfer with Percocet, Flexeril and prednisone 60 mg p.o.  Wish to go by private vehicle. ED Discharge Orders    None       Arby BarrettePfeiffer, Jahmeek Shirk, MD 08/29/17 [id number]

## 2017-08-29 NOTE — ED Notes (Signed)
Nurse will draw labs. 

## 2017-08-29 NOTE — ED Triage Notes (Signed)
Patient states that for the last year intermittently he has pain to his lower back with radiation down his legs. - the patient states that it is worse on the right then the left - the patient also reports that he now has numbness to the right leg and some in the left leg. He states that he can not walk or move. Denies any loss of bladder or bowel function - he reports tremors in his arms and now SOB

## 2017-08-29 NOTE — ED Provider Notes (Signed)
[facility] EMERGENCY DEPARTMENT Provider Note   CSN: [id number] Arrival date & time: 08/29/17  1227     History   Chief Complaint Chief Complaint  Patient presents with  . Back Pain    HPI Ricardo Heath is a 47 y.o. male.  47 year old male presents here with worsening back pain x4 days.  Patient initially had pain several months ago but has been progressing.  Pain is severe and in his lower lumbar spine and radiates down both legs with associated numbness and tingling.  No bowel or bladder dysfunction.  Seen at outside facility and sent here for MRI due to worsening symptoms.     Past Medical History:  Diagnosis Date  . Gout   . Hypertension     Patient Active Problem List   Diagnosis Date Noted  . Right shoulder pain 06/28/2015  . Forearm contusion 06/18/2015    History reviewed. No pertinent surgical history.      Home Medications    Prior to Admission medications   Medication Sig Start Date End Date Taking? Authorizing Provider  atorvastatin (LIPITOR) 10 MG tablet Take 10 mg by mouth daily.   Yes [provider]  benazepril (LOTENSIN) 40 MG tablet Take 40 mg by mouth daily.   Yes [provider]  chlorthalidone (HYGROTON) 25 MG tablet Take 12.5 mg by mouth daily.   Yes [provider]  diltiazem (CARDIZEM CD) 240 MG 24 hr capsule Take 240 mg by mouth daily.   Yes [provider]  ibuprofen (ADVIL,MOTRIN) 800 MG tablet Take 1 tablet (800 mg total) by mouth every 8 (eight) hours as needed for mild pain. 02/09/17  Yes Ward, Layla Maw, DO  allopurinol (ZYLOPRIM) 100 MG tablet Take 100 mg by mouth 3 (three) times daily.    [provider]  benazepril (LOTENSIN) 20 MG tablet  07/29/15   [provider]  benazepril (LOTENSIN) 20 MG tablet TK 2 TS PO D 07/29/15   [provider]  benazepril-hydrochlorthiazide (LOTENSIN HCT) 20-12.5 MG tablet Take 1 tablet by mouth daily.    [provider]  HYDROcodone-acetaminophen (NORCO/VICODIN) 5-325 MG tablet Take 2 tablets by mouth every 4 (four) hours as needed. 02/14/16   Elson Areas, PA-C  naproxen (NAPROSYN) 500 MG tablet Take 1 tablet (500 mg total) by mouth 2 (two) times daily. 06/17/15   [name], MD  predniSONE (DELTASONE) 10 MG tablet 6,5,4,3,2,1 taper 02/14/16   Elson Areas, PA-C    Family History History reviewed. No pertinent family history.  Social History Social History   Tobacco Use  . Smoking status: Current Some Day Smoker    Types: Cigarettes  . Smokeless tobacco: Never Used  Substance Use Topics  . Alcohol use: Yes    Alcohol/week: 0.0 oz    Comment: occ  . Drug use: No     Allergies   Promethazine   Review of Systems Review of Systems  All other systems reviewed and are negative.    Physical Exam Updated Vital Signs BP 134/75 (BP Location: Left Arm)   Pulse 73   Temp 98.7 F (37.1 C) (Oral)   Resp 20   Ht 1.702 m (5\' 7" )   Wt 114.8 kg (253 lb)   SpO2 100%   BMI 39.63 kg/m   Physical Exam  Constitutional: He is oriented to person, place, and time. He appears well-developed and well-nourished.  Non-toxic appearance. No distress.  HENT:  Head: Normocephalic and atraumatic.  Eyes: Pupils are equal, round, and reactive to light. Conjunctivae, EOM and lids are normal.  Neck: Normal range of motion. Neck supple. No tracheal deviation present. No thyroid mass present.  Cardiovascular: Normal rate, regular rhythm and normal heart sounds. Exam reveals no gallop.  No murmur heard. Pulmonary/Chest: Effort normal and breath sounds normal. No stridor. No respiratory distress. He has no decreased breath sounds. He has no wheezes. He has no rhonchi. He has no rales.  Abdominal: Soft. Normal appearance and bowel sounds are normal. He exhibits no distension. There is no tenderness. There is no rebound and no CVA tenderness.  Musculoskeletal: Normal range of motion. He  exhibits no edema or tenderness.       Back:  Neurological: He is alert and oriented to person, place, and time. He has normal strength. No cranial nerve deficit or sensory deficit. GCS eye subscore is 4. GCS verbal subscore is 5. GCS motor subscore is 6.  Strength 5 out of 5 in bilateral lower extremities.  No foot drop noted.  Skin: Skin is warm and dry. No abrasion and no rash noted.  Psychiatric: He has a normal mood and affect. His speech is normal and behavior is normal.  Nursing note and vitals reviewed.    ED Treatments / Results  Labs (all labs ordered are listed, but only abnormal results are displayed) Labs Reviewed - No data to display  EKG None  Radiology No results found.  Procedures Procedures (including critical care time)  Medications Ordered in ED Medications  diazepam (VALIUM) tablet 10 mg (has no administration in time range)  oxyCODONE-acetaminophen (PERCOCET/ROXICET) 5-325 MG per tablet 2 tablet (2 tablets Oral Given 08/29/17 1522)  predniSONE (DELTASONE) tablet 60 mg (60 mg Oral Given 08/29/17 1522)  cyclobenzaprine (FLEXERIL) tablet 10 mg (10 mg Oral Given 08/29/17 1522)     Initial Impression / Assessment and Plan / ED Course  I have reviewed the triage vital signs and the nursing notes.  Pertinent labs & imaging results that were available during my care of the patient were reviewed by me and considered in my medical decision making (see chart for details).    Patient medicated for pain here.  MRI of spine shows evidence of severe disc disease at L4-L5 with bulging.  Some impingement noted on the nerve roots of the cauda equina.  Patient has no signs of urinary retention or loss of bowel function.  He has no perineal numbness.  He is able to move his legs are probably.  Case discussed with Dr. Conchita ParisNundkumar on-call for neurosurgery who has reviewed the films.  He will admit the patient and see him in the morning.  Final Clinical Impressions(s) / ED Diagnoses     Final diagnoses:  Weakness of right lower extremity  Bilateral sciatica    ED Discharge Orders    None       Lorre NickAllen, Kerry Chisolm, MD 08/29/17 2324

## 2017-08-29 NOTE — ED Notes (Signed)
ED Provider at bedside. Dr. Pfeiffer 

## 2017-08-29 NOTE — ED Notes (Signed)
Pt informed he is next for MRI

## 2017-08-29 NOTE — ED Notes (Signed)
Pt OTF to MRI.

## 2017-08-29 NOTE — Progress Notes (Addendum)
I have spoken to Dr. Freida BusmanAllen and reviewed Mr. Ricardo Heath MRI. He presented with several days of acute exacerbation of chronic back pain and difficulty walking. Per ED physician, no symptoms of cauda equina syndrome (perineal numbness, urinary retention). Does have pain limited leg weakness. MRI shows disc herniation with stenosis at L4-5 although exam is limited with only sagittal T2WI. Will admit and speak to pt in am regarding surgical option for decompression. He was informed as to the current closure of the OR and likelihood that surgery could not be done until at the earliest Tuesday, and requested admission here rather than transfer to another hospital.

## 2017-08-30 ENCOUNTER — Inpatient Hospital Stay (HOSPITAL_COMMUNITY): Payer: Self-pay

## 2017-08-30 ENCOUNTER — Other Ambulatory Visit: Payer: Self-pay | Admitting: Neurosurgery

## 2017-08-30 ENCOUNTER — Encounter (HOSPITAL_COMMUNITY): Payer: Self-pay | Admitting: *Deleted

## 2017-08-30 LAB — BASIC METABOLIC PANEL
Anion gap: 15 (ref 5–15)
BUN: 7 mg/dL (ref 6–20)
CALCIUM: 9.5 mg/dL (ref 8.9–10.3)
CO2: 26 mmol/L (ref 22–32)
CREATININE: 0.94 mg/dL (ref 0.61–1.24)
Chloride: 98 mmol/L (ref 98–111)
GFR calc non Af Amer: >60 mL/min (ref >60)
Glucose, Bld: 183 mg/dL — ABNORMAL HIGH (ref 70–99)
Potassium: 3.8 mmol/L (ref 3.5–5.1)
Sodium: 139 mmol/L (ref 135–145)

## 2017-08-30 LAB — CBC WITH DIFFERENTIAL/PLATELET
Abs Immature Granulocytes: 0 10*3/uL (ref 0.0–0.1)
Basophils Absolute: 0 10*3/uL (ref 0.0–0.1)
Basophils Relative: 0 %
Eosinophils Absolute: 0 10*3/uL (ref 0.0–0.7)
Eosinophils Relative: 0 %
HCT: 42.1 % (ref 39.0–52.0)
Hemoglobin: 14.1 g/dL (ref 13.0–17.0)
Immature Granulocytes: 0 %
Lymphocytes Relative: 14 %
Lymphs Abs: 0.7 10*3/uL (ref 0.7–4.0)
MCH: 28.3 pg (ref 26.0–34.0)
MCHC: 33.5 g/dL (ref 30.0–36.0)
MCV: 84.5 fL (ref 78.0–100.0)
Monocytes Absolute: 0.1 10*3/uL (ref 0.1–1.0)
Monocytes Relative: 2 %
Neutro Abs: 3.8 10*3/uL (ref 1.7–7.7)
Neutrophils Relative %: 82 %
Platelets: 261 10*3/uL (ref 150–400)
RBC: 4.98 MIL/uL (ref 4.22–5.81)
RDW: 13.9 % (ref 11.5–15.5)
WBC: 4.6 10*3/uL (ref 4.0–10.5)

## 2017-08-30 MED ORDER — METHOCARBAMOL 1000 MG/10ML IJ SOLN
500.0000 mg | Freq: Four times a day (QID) | INTRAVENOUS | Status: DC | PRN
  Filled 2017-08-30: qty 5

## 2017-08-30 MED ORDER — ACETAMINOPHEN 325 MG PO TABS
650.0000 mg | ORAL_TABLET | ORAL | Status: DC | PRN
  Administered 2017-08-30 – 2017-08-31 (×2): 650 mg via ORAL
  Filled 2017-08-30 (×2): qty 2

## 2017-08-30 MED ORDER — HYDROMORPHONE HCL 1 MG/ML IJ SOLN
0.5000 mg | INTRAMUSCULAR | Status: DC | PRN
  Administered 2017-08-30 – 2017-08-31 (×7): 1 mg via INTRAVENOUS
  Administered 2017-08-31: 0.5 mg via INTRAVENOUS
  Administered 2017-08-31: 1 mg via INTRAVENOUS
  Administered 2017-09-01: 0.5 mg via INTRAVENOUS
  Administered 2017-09-01 (×4): 1 mg via INTRAVENOUS
  Filled 2017-08-30 (×10): qty 1
  Filled 2017-08-30 (×2): qty 0.5
  Filled 2017-08-30 (×2): qty 1

## 2017-08-30 MED ORDER — ACETAMINOPHEN 500 MG PO TABS
1000.0000 mg | ORAL_TABLET | Freq: Four times a day (QID) | ORAL | Status: AC
  Administered 2017-08-30 (×4): 1000 mg via ORAL
  Filled 2017-08-30 (×5): qty 2

## 2017-08-30 MED ORDER — FLEET ENEMA 7-19 GM/118ML RE ENEM: 1.0000 | ENEMA | Freq: Once | RECTAL | Status: DC | PRN

## 2017-08-30 MED ORDER — METHOCARBAMOL 500 MG PO TABS
500.0000 mg | ORAL_TABLET | Freq: Four times a day (QID) | ORAL | Status: DC | PRN
  Administered 2017-08-30 – 2017-08-31 (×5): 500 mg via ORAL
  Filled 2017-08-30 (×5): qty 1

## 2017-08-30 MED ORDER — SENNOSIDES-DOCUSATE SODIUM 8.6-50 MG PO TABS: 1.0000 | ORAL_TABLET | Freq: Every evening | ORAL | Status: DC | PRN

## 2017-08-30 MED ORDER — OXYCODONE HCL 5 MG PO TABS
5.0000 mg | ORAL_TABLET | ORAL | Status: DC | PRN
  Administered 2017-08-30 – 2017-09-01 (×8): 10 mg via ORAL
  Filled 2017-08-30 (×8): qty 2

## 2017-08-30 MED ORDER — HYDROCODONE-ACETAMINOPHEN 5-325 MG PO TABS
1.0000 | ORAL_TABLET | ORAL | Status: DC | PRN
  Administered 2017-08-30 – 2017-08-31 (×3): 1 via ORAL
  Filled 2017-08-30 (×4): qty 1

## 2017-08-30 MED ORDER — DOCUSATE SODIUM 100 MG PO CAPS
100.0000 mg | ORAL_CAPSULE | Freq: Two times a day (BID) | ORAL | Status: DC
  Administered 2017-08-30 – 2017-09-01 (×5): 100 mg via ORAL
  Filled 2017-08-30 (×5): qty 1

## 2017-08-30 MED ORDER — SODIUM CHLORIDE 0.9 % IV SOLN: INTRAVENOUS | Status: DC

## 2017-08-30 MED ORDER — ALPRAZOLAM 0.5 MG PO TABS
0.5000 mg | ORAL_TABLET | Freq: Once | ORAL | Status: AC | PRN
  Administered 2017-08-30: 0.5 mg via ORAL
  Filled 2017-08-30: qty 1

## 2017-08-30 MED ORDER — SODIUM CHLORIDE 0.9 % IV SOLN: 250.0000 mL | INTRAVENOUS | Status: DC

## 2017-08-30 MED ORDER — SODIUM CHLORIDE 0.9% FLUSH
3.0000 mL | INTRAVENOUS | Status: DC | PRN
Start: 2017-08-30 — End: 2017-09-01

## 2017-08-30 MED ORDER — ACETAMINOPHEN 650 MG RE SUPP: 650.0000 mg | RECTAL | Status: DC | PRN

## 2017-08-30 MED ORDER — SENNA 8.6 MG PO TABS
1.0000 | ORAL_TABLET | Freq: Two times a day (BID) | ORAL | Status: DC
  Administered 2017-08-30 – 2017-09-01 (×5): 8.6 mg via ORAL
  Filled 2017-08-30 (×5): qty 1

## 2017-08-30 MED ORDER — BISACODYL 10 MG RE SUPP: 10.0000 mg | Freq: Every day | RECTAL | Status: DC | PRN

## 2017-08-30 MED ORDER — ONDANSETRON HCL 4 MG PO TABS: 4.0000 mg | ORAL_TABLET | Freq: Four times a day (QID) | ORAL | Status: DC | PRN

## 2017-08-30 MED ORDER — ONDANSETRON HCL 4 MG/2ML IJ SOLN: 4.0000 mg | Freq: Four times a day (QID) | INTRAMUSCULAR | Status: DC | PRN

## 2017-08-30 MED ORDER — SODIUM CHLORIDE 0.9% FLUSH
3.0000 mL | Freq: Two times a day (BID) | INTRAVENOUS | Status: DC
  Administered 2017-08-30 – 2017-08-31 (×5): 3 mL via INTRAVENOUS

## 2017-08-30 MED ORDER — GABAPENTIN 300 MG PO CAPS
300.0000 mg | ORAL_CAPSULE | Freq: Three times a day (TID) | ORAL | Status: DC
  Administered 2017-08-30 – 2017-09-01 (×8): 300 mg via ORAL
  Filled 2017-08-30 (×8): qty 1

## 2017-08-30 NOTE — Plan of Care (Signed)
  Problem: Pain Managment: Goal: General experience of comfort will improve Outcome: Progressing   Problem: Safety: Goal: Ability to remain free from injury will improve Outcome: Progressing   Problem: Skin Integrity: Goal: Risk for impaired skin integrity will decrease Outcome: Progressing   

## 2017-08-30 NOTE — H&P (Addendum)
Chief Complaint   Chief Complaint  Patient presents with  . Back Pain    History of Present Illness  Ricardo Heath is a 47 y.o. male with a history of relatively chronic back pain over the last several years.  He says over the years he has had mild exacerbations of back pain which have been treated fairly well conservatively with anti-inflammatories and muscle relaxers.  Unfortunately, about 4 or 5 days ago without any identifiable inciting event he had severe exacerbation of pain across his lower back, with radiation through both buttocks and down the back of both legs.  He also describes some numbness involving his feet.  He does not say one leg is worse than the other.  He has not noted any changes in bowel or bladder function, or any groin/perineal numbness at the same time.  Because of the severe pain, he has been having a very hard time walking, and ultimately came to the ER for the pain.  Past Medical History   Past Medical History:  Diagnosis Date  . Gout   . Hypertension     Past Surgical History  History reviewed. No pertinent surgical history.  Social History   Social History   Tobacco Use  . Smoking status: Current Some Day Smoker    Types: Cigarettes  . Smokeless tobacco: Never Used  Substance Use Topics  . Alcohol use: Yes    Alcohol/week: 0.0 oz    Comment: occ  . Drug use: No    Medications   Prior to Admission medications   Medication Sig Start Date End Date Taking? Authorizing Provider  atorvastatin (LIPITOR) 10 MG tablet Take 10 mg by mouth daily.   Yes [provider]  benazepril (LOTENSIN) 40 MG tablet Take 40 mg by mouth daily.   Yes [provider]  chlorthalidone (HYGROTON) 25 MG tablet Take 12.5 mg by mouth daily.   Yes [provider]  diltiazem (CARDIZEM CD) 240 MG 24 hr capsule Take 240 mg by mouth daily.   Yes [provider]  ibuprofen (ADVIL,MOTRIN) 800 MG tablet Take 1 tablet (800 mg total) by mouth  every 8 (eight) hours as needed for mild pain. 02/09/17  Yes Ward, Layla Maw, DO    Allergies   Allergies  Allergen Reactions  . Promethazine Palpitations    Review of Systems  ROS  Neurologic Exam  Awake, alert, oriented Memory and concentration grossly intact Speech fluent, appropriate CN grossly intact Motor exam: Upper Extremities Deltoid Bicep Tricep Grip  Right 5/5 5/5 5/5 5/5  Left 5/5 5/5 5/5 5/5   Lower Extremities IP Quad PF DF EHL  Right 5/5 5/5 5/5 5/5 5/5  Left 5/5 5/5 5/5 5/5 5/5   Sensation grossly intact to LT  Imaging  MRI of the lumbar spine, limited to sagittal T2-weighted images was reviewed.  This demonstrates disc desiccation with some loss of height primarily at L4-5, with fairly large disc herniation and resultant severe stenosis.  Impression  - 47 y.o. male with history of chronic low back pain, with acute exacerbation of back and bilateral leg pain likely related to disc herniation at L4-5.  Plan  -We will plan on proceeding with bilateral L4-5 laminotomy and microdiscectomy for decompression of thecal sac and exiting nerve roots.  I spoken to the operating room, we will plan on surgery Wednesday morning first case 8:30 AM - Will plan on completing MRI Lumbar spine without contrast. - NPO p MN tomorrow  I did review  the MRI findings with the patient and his family.  I told him we certainly have the option to treat conservatively and see if this does improve spontaneously.  Given the chronicity of his symptoms in the past, I did also offer surgical decompression.  Risks of the surgery were discussed in detail including the risk of nerve root injury leading to leg foot weakness or bowel bladder dysfunction, bleeding, infection, and fluid leak.  We also did discuss the possibility of persistent or worsening pain, and the possible need for future surgeries.  Patient appeared to understand our discussion, and does wish to proceed with surgery.  All his  questions were answered.  He provided verbal consent.

## 2017-08-31 MED ORDER — CHLORHEXIDINE GLUCONATE CLOTH 2 % EX PADS
6.0000 | MEDICATED_PAD | Freq: Once | CUTANEOUS | Status: AC
  Administered 2017-08-31: 6 via TOPICAL

## 2017-08-31 MED ORDER — CEFAZOLIN SODIUM-DEXTROSE 2-4 GM/100ML-% IV SOLN
2.0000 g | INTRAVENOUS | Status: AC
  Administered 2017-09-01: 2 g via INTRAVENOUS
  Filled 2017-08-31: qty 100

## 2017-08-31 NOTE — Progress Notes (Signed)
  NEUROSURGERY PROGRESS NOTE   No issues overnight. Unchanged back and bilateral leg pain.  EXAM:  BP 128/78 (BP Location: Left Arm)   Pulse 79   Temp 98 F (36.7 C) (Oral)   Resp 20   Ht 5\' 7"  (1.702 m)   Wt 118.1 kg (260 lb 5.8 oz)   SpO2 100%   BMI 40.78 kg/m   Awake, alert, oriented  Speech fluent, appropriate  CN grossly intact  5/5 BUE/BLE   IMAGING: MRI lumbar spine reviewed, demonstrates large central slightly left eccentric L4-5 disc herniation with severe stenosis.  IMPRESSION:  47 y.o. male with large L4-5 disc herniation causing severe back and bilateral leg pain significantly impacting ambulation.  PLAN: - OR tomorrow for bilateral L4-5 laminotomy/microdiscectomy  I have reviewed the imaging findings with the patient. Treatment options were discussed. We again reviewed the details of surgery and expected postop course. All questions were answered today.

## 2017-09-01 ENCOUNTER — Encounter (HOSPITAL_COMMUNITY)
Admission: EM | Disposition: A | Discharge status: Home / Self Care | Payer: Self-pay | Source: Home / Self Care | Attending: Neurosurgery

## 2017-09-01 ENCOUNTER — Inpatient Hospital Stay (HOSPITAL_COMMUNITY): Payer: Self-pay | Admitting: Certified Registered"

## 2017-09-01 ENCOUNTER — Inpatient Hospital Stay (HOSPITAL_COMMUNITY): Payer: Self-pay

## 2017-09-01 ENCOUNTER — Encounter (HOSPITAL_COMMUNITY): Payer: Self-pay | Admitting: Certified Registered"

## 2017-09-01 HISTORY — PX: LUMBAR LAMINECTOMY/DECOMPRESSION MICRODISCECTOMY: SHX5026

## 2017-09-01 LAB — SURGICAL PCR SCREEN
MRSA, PCR: NEGATIVE
STAPHYLOCOCCUS AUREUS: NEGATIVE

## 2017-09-01 SURGERY — LUMBAR LAMINECTOMY/DECOMPRESSION MICRODISCECTOMY 1 LEVEL
Anesthesia: General | Site: Spine Lumbar

## 2017-09-01 MED ORDER — ROCURONIUM BROMIDE 100 MG/10ML IV SOLN
INTRAVENOUS | Status: DC | PRN
  Administered 2017-09-01: 60 mg via INTRAVENOUS

## 2017-09-01 MED ORDER — ONDANSETRON HCL 4 MG/2ML IJ SOLN
INTRAMUSCULAR | Status: AC
  Filled 2017-09-01: qty 2

## 2017-09-01 MED ORDER — LACTATED RINGERS IV SOLN: INTRAVENOUS | Status: DC

## 2017-09-01 MED ORDER — SUGAMMADEX SODIUM 200 MG/2ML IV SOLN
INTRAVENOUS | Status: DC | PRN
  Administered 2017-09-01: 300 mg via INTRAVENOUS

## 2017-09-01 MED ORDER — BUPIVACAINE HCL (PF) 0.5 % IJ SOLN
INTRAMUSCULAR | Status: AC
  Filled 2017-09-01: qty 30

## 2017-09-01 MED ORDER — LIDOCAINE-EPINEPHRINE 1 %-1:100000 IJ SOLN
INTRAMUSCULAR | Status: DC | PRN
  Administered 2017-09-01: 5 mL

## 2017-09-01 MED ORDER — SODIUM CHLORIDE 0.9 % IV SOLN
INTRAVENOUS | Status: DC | PRN
  Administered 2017-09-01: 500 mL

## 2017-09-01 MED ORDER — DEXAMETHASONE SODIUM PHOSPHATE 10 MG/ML IJ SOLN
INTRAMUSCULAR | Status: AC
  Filled 2017-09-01: qty 1

## 2017-09-01 MED ORDER — LIDOCAINE-EPINEPHRINE 1 %-1:100000 IJ SOLN
INTRAMUSCULAR | Status: AC
  Filled 2017-09-01: qty 1

## 2017-09-01 MED ORDER — PROPOFOL 10 MG/ML IV BOLUS
INTRAVENOUS | Status: AC
  Filled 2017-09-01: qty 20

## 2017-09-01 MED ORDER — CEFAZOLIN SODIUM-DEXTROSE 2-4 GM/100ML-% IV SOLN
INTRAVENOUS | Status: AC
  Filled 2017-09-01: qty 100

## 2017-09-01 MED ORDER — LIDOCAINE 2% (20 MG/ML) 5 ML SYRINGE
INTRAMUSCULAR | Status: AC
  Filled 2017-09-01: qty 5

## 2017-09-01 MED ORDER — LACTATED RINGERS IV SOLN
INTRAVENOUS | Status: DC | PRN
  Administered 2017-09-01 (×2): via INTRAVENOUS

## 2017-09-01 MED ORDER — THROMBIN 5000 UNITS EX SOLR
CUTANEOUS | Status: AC
  Filled 2017-09-01: qty 10000

## 2017-09-01 MED ORDER — FENTANYL CITRATE (PF) 100 MCG/2ML IJ SOLN
25.0000 ug | INTRAMUSCULAR | Status: DC | PRN
  Administered 2017-09-01: 50 ug via INTRAVENOUS

## 2017-09-01 MED ORDER — DEXAMETHASONE SODIUM PHOSPHATE 4 MG/ML IJ SOLN: INTRAMUSCULAR | Status: DC | PRN

## 2017-09-01 MED ORDER — PHENYLEPHRINE 40 MCG/ML (10ML) SYRINGE FOR IV PUSH (FOR BLOOD PRESSURE SUPPORT)
PREFILLED_SYRINGE | INTRAVENOUS | Status: DC | PRN
  Administered 2017-09-01 (×2): 120 ug via INTRAVENOUS
  Administered 2017-09-01 (×2): 80 ug via INTRAVENOUS

## 2017-09-01 MED ORDER — ONDANSETRON HCL 4 MG/2ML IJ SOLN
INTRAMUSCULAR | Status: DC | PRN
  Administered 2017-09-01: 4 mg via INTRAVENOUS

## 2017-09-01 MED ORDER — GABAPENTIN 300 MG PO CAPS: 300.0000 mg | ORAL_CAPSULE | Freq: Three times a day (TID) | ORAL | 2 refills | Status: DC

## 2017-09-01 MED ORDER — METHYLPREDNISOLONE ACETATE 80 MG/ML IJ SUSP
INTRAMUSCULAR | Status: AC
  Filled 2017-09-01: qty 1

## 2017-09-01 MED ORDER — 0.9 % SODIUM CHLORIDE (POUR BTL) OPTIME
TOPICAL | Status: DC | PRN
  Administered 2017-09-01: 1000 mL

## 2017-09-01 MED ORDER — GLYCOPYRROLATE PF 0.2 MG/ML IJ SOSY
PREFILLED_SYRINGE | INTRAMUSCULAR | Status: AC
  Filled 2017-09-01: qty 1

## 2017-09-01 MED ORDER — MIDAZOLAM HCL 5 MG/5ML IJ SOLN
INTRAMUSCULAR | Status: DC | PRN
  Administered 2017-09-01: 2 mg via INTRAVENOUS

## 2017-09-01 MED ORDER — DEXAMETHASONE SODIUM PHOSPHATE 10 MG/ML IJ SOLN
INTRAMUSCULAR | Status: DC | PRN
  Administered 2017-09-01: 10 mg via INTRAVENOUS

## 2017-09-01 MED ORDER — MEPERIDINE HCL 50 MG/ML IJ SOLN: 6.2500 mg | INTRAMUSCULAR | Status: DC | PRN

## 2017-09-01 MED ORDER — BUPIVACAINE HCL (PF) 0.5 % IJ SOLN
INTRAMUSCULAR | Status: DC | PRN
  Administered 2017-09-01: 5 mL

## 2017-09-01 MED ORDER — PHENYLEPHRINE 40 MCG/ML (10ML) SYRINGE FOR IV PUSH (FOR BLOOD PRESSURE SUPPORT)
PREFILLED_SYRINGE | INTRAVENOUS | Status: AC
Start: 2017-09-01 — End: ?
  Filled 2017-09-01: qty 10

## 2017-09-01 MED ORDER — METOCLOPRAMIDE HCL 5 MG/ML IJ SOLN: 10.0000 mg | Freq: Once | INTRAMUSCULAR | Status: DC | PRN

## 2017-09-01 MED ORDER — FENTANYL CITRATE (PF) 100 MCG/2ML IJ SOLN
INTRAMUSCULAR | Status: AC
  Filled 2017-09-01: qty 2

## 2017-09-01 MED ORDER — FENTANYL CITRATE (PF) 250 MCG/5ML IJ SOLN
INTRAMUSCULAR | Status: AC
  Filled 2017-09-01: qty 5

## 2017-09-01 MED ORDER — HEMOSTATIC AGENTS (NO CHARGE) OPTIME
TOPICAL | Status: DC | PRN
  Administered 2017-09-01: 1 via TOPICAL

## 2017-09-01 MED ORDER — CEFAZOLIN SODIUM-DEXTROSE 2-3 GM-%(50ML) IV SOLR
INTRAVENOUS | Status: DC | PRN
  Administered 2017-09-01: 2 g via INTRAVENOUS

## 2017-09-01 MED ORDER — METHOCARBAMOL 500 MG PO TABS: 500.0000 mg | ORAL_TABLET | Freq: Four times a day (QID) | ORAL | 1 refills | Status: DC | PRN

## 2017-09-01 MED ORDER — FENTANYL CITRATE (PF) 100 MCG/2ML IJ SOLN
INTRAMUSCULAR | Status: DC | PRN
  Administered 2017-09-01 (×2): 100 ug via INTRAVENOUS
  Administered 2017-09-01 (×2): 50 ug via INTRAVENOUS

## 2017-09-01 MED ORDER — SUGAMMADEX SODIUM 500 MG/5ML IV SOLN
INTRAVENOUS | Status: AC
  Filled 2017-09-01: qty 5

## 2017-09-01 MED ORDER — MIDAZOLAM HCL 2 MG/2ML IJ SOLN
INTRAMUSCULAR | Status: AC
Start: 2017-09-01 — End: ?
  Filled 2017-09-01: qty 2

## 2017-09-01 MED ORDER — PROPOFOL 10 MG/ML IV BOLUS
INTRAVENOUS | Status: DC | PRN
  Administered 2017-09-01: 200 mg via INTRAVENOUS

## 2017-09-01 MED ORDER — SUGAMMADEX SODIUM 200 MG/2ML IV SOLN
INTRAVENOUS | Status: AC
Start: 2017-09-01 — End: ?
  Filled 2017-09-01: qty 2

## 2017-09-01 MED ORDER — PHENYLEPHRINE HCL 10 MG/ML IJ SOLN
INTRAVENOUS | Status: DC | PRN
  Administered 2017-09-01: 80 ug/min via INTRAVENOUS

## 2017-09-01 MED ORDER — GLYCOPYRROLATE 0.2 MG/ML IJ SOLN
INTRAMUSCULAR | Status: DC | PRN
  Administered 2017-09-01: 0.1 mg via INTRAVENOUS

## 2017-09-01 MED ORDER — OXYCODONE-ACETAMINOPHEN 7.5-325 MG PO TABS: 1.0000 | ORAL_TABLET | ORAL | 0 refills | Status: DC | PRN

## 2017-09-01 MED ORDER — ROCURONIUM BROMIDE 10 MG/ML (PF) SYRINGE
PREFILLED_SYRINGE | INTRAVENOUS | Status: AC
  Filled 2017-09-01: qty 10

## 2017-09-01 MED ORDER — EPHEDRINE SULFATE-NACL 50-0.9 MG/10ML-% IV SOSY
PREFILLED_SYRINGE | INTRAVENOUS | Status: DC | PRN
  Administered 2017-09-01 (×2): 5 mg via INTRAVENOUS

## 2017-09-01 MED ORDER — LIDOCAINE 2% (20 MG/ML) 5 ML SYRINGE
INTRAMUSCULAR | Status: DC | PRN
  Administered 2017-09-01: 100 mg via INTRAVENOUS

## 2017-09-01 MED ORDER — EPHEDRINE SULFATE 50 MG/ML IJ SOLN
INTRAMUSCULAR | Status: AC
  Filled 2017-09-01: qty 1

## 2017-09-01 MED ORDER — GLYCOPYRROLATE PF 0.2 MG/ML IJ SOSY: PREFILLED_SYRINGE | INTRAMUSCULAR | Status: DC | PRN

## 2017-09-01 SURGICAL SUPPLY — 64 items
BAG DECANTER FOR FLEXI CONT (MISCELLANEOUS) ×3 IMPLANT
BENZOIN TINCTURE PRP APPL 2/3 (GAUZE/BANDAGES/DRESSINGS) IMPLANT
BLADE CLIPPER SURG (BLADE) IMPLANT
BLADE SURG 11 STRL SS (BLADE) ×3 IMPLANT
BUR MATCHSTICK NEURO 3.0 LAGG (BURR) ×3 IMPLANT
BUR PRECISION FLUTE 5.0 (BURR) ×3 IMPLANT
CANISTER SUCT 3000ML PPV (MISCELLANEOUS) ×3 IMPLANT
CARTRIDGE OIL MAESTRO DRILL (MISCELLANEOUS) ×1 IMPLANT
CLOSURE WOUND 1/2 X4 (GAUZE/BANDAGES/DRESSINGS)
DECANTER SPIKE VIAL GLASS SM (MISCELLANEOUS) ×3 IMPLANT
DERMABOND ADVANCED (GAUZE/BANDAGES/DRESSINGS) ×2
DERMABOND ADVANCED .7 DNX12 (GAUZE/BANDAGES/DRESSINGS) ×1 IMPLANT
DIFFUSER DRILL AIR PNEUMATIC (MISCELLANEOUS) ×3 IMPLANT
DRAPE LAPAROTOMY 100X72X124 (DRAPES) ×3 IMPLANT
DRAPE MICROSCOPE LEICA (MISCELLANEOUS) ×3 IMPLANT
DRAPE SURG 17X23 STRL (DRAPES) ×3 IMPLANT
DRSG OPSITE POSTOP 3X4 (GAUZE/BANDAGES/DRESSINGS) ×3 IMPLANT
DURAPREP 26ML APPLICATOR (WOUND CARE) ×3 IMPLANT
ELECT REM PT RETURN 9FT ADLT (ELECTROSURGICAL) ×3
ELECTRODE REM PT RTRN 9FT ADLT (ELECTROSURGICAL) ×1 IMPLANT
FLOSEAL 5ML (HEMOSTASIS) ×3 IMPLANT
GAUZE SPONGE 4X4 12PLY STRL (GAUZE/BANDAGES/DRESSINGS) IMPLANT
GAUZE SPONGE 4X4 16PLY XRAY LF (GAUZE/BANDAGES/DRESSINGS) IMPLANT
GLOVE BIO SURGEON STRL SZ7 (GLOVE) IMPLANT
GLOVE BIO SURGEON STRL SZ7.5 (GLOVE) ×3 IMPLANT
GLOVE BIOGEL PI IND STRL 6.5 (GLOVE) ×1 IMPLANT
GLOVE BIOGEL PI IND STRL 7.0 (GLOVE) IMPLANT
GLOVE BIOGEL PI IND STRL 7.5 (GLOVE) ×2 IMPLANT
GLOVE BIOGEL PI INDICATOR 6.5 (GLOVE) ×2
GLOVE BIOGEL PI INDICATOR 7.0 (GLOVE)
GLOVE BIOGEL PI INDICATOR 7.5 (GLOVE) ×4
GLOVE ECLIPSE 7.0 STRL STRAW (GLOVE) ×3 IMPLANT
GLOVE EXAM NITRILE LRG STRL (GLOVE) IMPLANT
GLOVE EXAM NITRILE XL STR (GLOVE) IMPLANT
GLOVE EXAM NITRILE XS STR PU (GLOVE) IMPLANT
GLOVE SURG SS PI 6.5 STRL IVOR (GLOVE) ×12 IMPLANT
GOWN STRL REUS W/ TWL LRG LVL3 (GOWN DISPOSABLE) ×4 IMPLANT
GOWN STRL REUS W/ TWL XL LVL3 (GOWN DISPOSABLE) ×1 IMPLANT
GOWN STRL REUS W/TWL 2XL LVL3 (GOWN DISPOSABLE) IMPLANT
GOWN STRL REUS W/TWL LRG LVL3 (GOWN DISPOSABLE) ×8
GOWN STRL REUS W/TWL XL LVL3 (GOWN DISPOSABLE) ×2
GRAFT DURAGEN MATRIX 1WX1L (Tissue) ×3 IMPLANT
HEMOSTAT POWDER KIT SURGIFOAM (HEMOSTASIS) IMPLANT
KIT BASIN OR (CUSTOM PROCEDURE TRAY) ×3 IMPLANT
KIT TURNOVER KIT B (KITS) ×3 IMPLANT
NEEDLE HYPO 18GX1.5 BLUNT FILL (NEEDLE) IMPLANT
NEEDLE HYPO 22GX1.5 SAFETY (NEEDLE) ×3 IMPLANT
NEEDLE SPNL 18GX3.5 QUINCKE PK (NEEDLE) ×3 IMPLANT
NS IRRIG 1000ML POUR BTL (IV SOLUTION) ×3 IMPLANT
OIL CARTRIDGE MAESTRO DRILL (MISCELLANEOUS) ×3
PACK LAMINECTOMY NEURO (CUSTOM PROCEDURE TRAY) ×3 IMPLANT
PAD ARMBOARD 7.5X6 YLW CONV (MISCELLANEOUS) ×9 IMPLANT
RUBBERBAND STERILE (MISCELLANEOUS) ×6 IMPLANT
SPONGE LAP 4X18 RFD (DISPOSABLE) IMPLANT
SPONGE SURGIFOAM ABS GEL SZ50 (HEMOSTASIS) IMPLANT
STRIP CLOSURE SKIN 1/2X4 (GAUZE/BANDAGES/DRESSINGS) IMPLANT
SUT VIC AB 0 CT1 18XCR BRD8 (SUTURE) ×2 IMPLANT
SUT VIC AB 0 CT1 8-18 (SUTURE) ×4
SUT VIC AB 2-0 CT1 18 (SUTURE) IMPLANT
SUT VICRYL 3-0 RB1 18 ABS (SUTURE) ×6 IMPLANT
SYR 3ML LL SCALE MARK (SYRINGE) IMPLANT
TOWEL GREEN STERILE (TOWEL DISPOSABLE) ×3 IMPLANT
TOWEL GREEN STERILE FF (TOWEL DISPOSABLE) ×3 IMPLANT
WATER STERILE IRR 1000ML POUR (IV SOLUTION) ×3 IMPLANT

## 2017-09-01 NOTE — Progress Notes (Signed)
Patient taken to OR.

## 2017-09-01 NOTE — Op Note (Signed)
NEUROSURGERY OPERATIVE NOTE   PREOP DIAGNOSIS: Lumbar disc herniation, L4-5  POSTOP DIAGNOSIS: Same  PROCEDURE: 1. Bilateral L4-5 laminotomy and microdiscectomy for decompression of nerve root 2. Use of operating microscope  SURGEON: Dr. Lisbeth RenshawNeelesh Danon Lograsso, MD  ASSISTANT: Cindra PresumeVincent Costella, PA-C  ANESTHESIA: General Endotracheal  EBL: 50cc  SPECIMENS: None  DRAINS: None  COMPLICATIONS: None immediate  CONDITION: Hemodynamically stable to PACU  HISTORY: Ricardo DecantRobert Heath is a 47 y.o. male initially seen and admitted through the emergency department with severe exacerbation of chronic low back and bilateral leg pain.  His MRI demonstrated a large central to left eccentric disc herniation at L4-5 with severe stenosis.  After treatment options were discussed including continued conservative treatment, he elected to proceed with surgical decompression.  The risks and benefits of the surgery were explained in detail to the patient and his wife.  After all questions were answered informed consent was obtained and witnessed.  PROCEDURE IN DETAIL: The patient was brought to the operating room via stretcher. After induction of general anesthesia, the patient was positioned on the operative table in the prone position with all pressure points meticulously padded. The skin of the low back was then prepped and draped in the usual sterile fashion.  Under fluoroscopy, the correct level was identified and marked out on the skin, and after timeout was conducted, the skin was infiltrated with local anesthetic. Skin incision was then made sharply and Bovie electrocautery was used to dissect the subcutaneous tissue until the lumbodorsal fascia was identified. The fascia was then incised using Bovie electrocautery and the lamina at the L4 and L5 levels was identified and dissection was carried out in the subperiosteal plane bilaterally. Self-retaining retractor was then placed, and intraoperative x-ray was  taken to confirm we were at the correct level.  Using a high-speed drill, initially on the left a laminotomy was completed with a partial medial facetectomy. The ligamentum flavum was then identified and removed and the lateral edge of the thecal sac was identified. This was then traced down to identify the traversing nerve root.    In a similar fashion, laminotomy was completed on the right side.  I drilled out the medial portion of the right facet.  The ligamentum flavum was identified, elevated, and removed.  Identified the lateral edge of the thecal sac on the right as well as the traversing right L5 nerve root.  I did note a small linear dural tear on the lateral edge of the right L5 nerve root, however the arachnoid was intact, without any CSF egress.  I then shifted attention back to the left side.  The ventral epidural space was dissected with a blunt dissector.  Epidural veins were coagulated.  The thecal sac was gently retracted medially, and using a knife a small incision was made in the annulus.  Using a combination of right-angle dissectors, I removed multiple large disc fragments from the ventral epidural space. Once this was done, I was freely able to pass a long dissect her in the ventral epidural space, without any identifiable compression of the thecal sac.  I was in fact able to pass the dissector from the left side all the way to the right and visualize it indicating no significant compression.  Hemostasis was then secured using a combination of morcellized Gelfoam and thrombin and bipolar electrocautery. The wound is irrigated with copious amounts of antibiotic saline irrigation. The nerve root was then covered with a long-acting steroid solution. Self-retaining retractor was then removed, and  the wound is closed in layers using a combination of interrupted 0 Vicryl and 3-0 Vicryl stitches. The skin was closed using standard skin glue.  At the end of the case all sponge, needle, and  instrument counts were correct. The patient was then transferred to the stretcher and taken to the postanesthesia care unit in stable hemodynamic condition.

## 2017-09-01 NOTE — Progress Notes (Signed)
Patient and family given d/c summary and teaching. No new questions or concerns. Able to teach back. D/C summary signed and in chart. IV removed. Dishcharged from unit in wheelchair with staff

## 2017-09-01 NOTE — Progress Notes (Addendum)
  NEUROSURGERY PROGRESS NOTE   No issues overnight. No new complaints. Ready to proceed with surgery.  EXAM:  BP (!) 144/78 (BP Location: Left Arm)   Pulse 90   Temp 98 F (36.7 C) (Oral)   Resp 14   Ht 5\' 7"  (1.702 m)   Wt 118.1 kg (260 lb 5.8 oz)   SpO2 99%   BMI 40.78 kg/m   Awake, alert, oriented  Speech fluent, appropriate  CN grossly intact  5/5 BUE/BLE   IMPRESSION:  47 y.o. male with large L4-5 disc herniation  PLAN: - Will proceed with bilateral L4-5 laminotomy/microdisc today.

## 2017-09-01 NOTE — Progress Notes (Signed)
Returned to unit from PACU.

## 2017-09-01 NOTE — Anesthesia Postprocedure Evaluation (Signed)
Anesthesia Post Note  Patient: Ricardo DecantRobert Heath  Procedure(s) Performed: Lumbar four-five Laminectomy/Microdiscectomy (N/A Spine Lumbar)     Patient location during evaluation: PACU Anesthesia Type: General Level of consciousness: awake and alert Pain management: pain level controlled Vital Signs Assessment: post-procedure vital signs reviewed and stable Respiratory status: spontaneous breathing, nonlabored ventilation, respiratory function stable and patient connected to nasal cannula oxygen Cardiovascular status: blood pressure returned to baseline and stable Postop Assessment: no apparent nausea or vomiting Anesthetic complications: no    Last Vitals:  Vitals:   09/01/17 1115 09/01/17 1130  BP: (!) 137/92 135/85  Pulse: 92 95  Resp: 19 17  Temp:  36.6 C  SpO2: 96% 97%    Last Pain:  Vitals:   09/01/17 1130  TempSrc:   PainSc: 5                  Phillips Groutarignan, Josue Falconi

## 2017-09-01 NOTE — Transfer of Care (Signed)
Immediate Anesthesia Transfer of Care Note  Patient: Ricardo Heath  Procedure(s) Performed: Lumbar four-five Laminectomy/Microdiscectomy (N/A Spine Lumbar)  Patient Location: PACU  Anesthesia Type:General  Level of Consciousness: awake, oriented and patient cooperative  Airway & Oxygen Therapy: Patient Spontanous Breathing and Patient connected to face mask oxygen  Post-op Assessment: Report given to RN, Post -op Vital signs reviewed and stable and Patient moving all extremities X 4  Post vital signs: Reviewed and stable  Last Vitals:  Vitals Value Taken Time  BP 143/87 09/01/2017 10:30 AM  Temp    Pulse 100 09/01/2017 10:32 AM  Resp 19 09/01/2017 10:32 AM  SpO2 100 % 09/01/2017 10:32 AM  Vitals shown include unvalidated device data.  Last Pain:  Vitals:   09/01/17 0559  TempSrc: Oral  PainSc:       Patients Stated Pain Goal: 0 (08/31/17 1400)  Complications: No apparent anesthesia complications

## 2017-09-01 NOTE — Discharge Summary (Signed)
Physician Discharge Summary  Patient ID: Ricardo Heath MRN: [id number] DOB/AGE: [birth year] 47 y.o.  Admit date: 08/29/2017 Discharge date: 09/01/2017  Admission Diagnoses:  Lumbar HNP  Discharge Diagnoses:  Same Active Problems:   Lumbar herniated disc   Discharged Condition: Stable  Hospital Course:  Ricardo Heath is a 47 y.o. male who was admitted Sunday 7/7 due to large HNP resulting in severe LBP with lwoer extremity pain and paresthesias. He was admitted for pain control and underwent below surgery on 7/10. There were no post operative complications. At time of discharge, pain was well controlled, ambulating with Pt/OT, tolerating po, voiding normal. Ready for discharge.  Treatments: Surgery - bilateral L4-5 laminotomy and microdiscectomy for decompression  Discharge Exam: Blood pressure (!) 144/78, pulse 90, temperature 98 F (36.7 C), temperature source Oral, resp. rate 14, height 5\' 7"  (1.702 m), weight 118.1 kg (260 lb 5.8 oz), SpO2 99 %. Awake, alert, oriented Speech fluent, appropriate CN grossly intact 5/5 BUE/BLE Wound c/d/i  Disposition: Discharge disposition: 01-Home or Self Care      Discharge Instructions    Call MD for:  difficulty breathing, headache or visual disturbances   Complete by:  As directed    Call MD for:  persistant dizziness or light-headedness   Complete by:  As directed    Call MD for:  redness, tenderness, or signs of infection (pain, swelling, redness, odor or green/yellow discharge around incision site)   Complete by:  As directed    Call MD for:  severe uncontrolled pain   Complete by:  As directed    Call MD for:  temperature >100.4   Complete by:  As directed    Diet general   Complete by:  As directed    Driving Restrictions   Complete by:  As directed    Do not drive until given clearance.   Increase activity slowly   Complete by:  As directed    Lifting restrictions   Complete by:  As directed    Do not lift anything  >10lbs. Avoid bending and twisting in awkward positions. Avoid bending at the back.   May shower / Bathe   Complete by:  As directed    In 24 hours. Okay to wash wound with warm soapy water. Avoid scrubbing the wound. Pat dry.   Remove dressing in 24 hours   Complete by:  As directed      Allergies as of 09/01/2017      Reactions   Promethazine Palpitations      Medication List    STOP taking these medications   ibuprofen 800 MG tablet Commonly known as:  ADVIL,MOTRIN     TAKE these medications   atorvastatin 10 MG tablet Commonly known as:  LIPITOR Take 10 mg by mouth daily.   benazepril 40 MG tablet Commonly known as:  LOTENSIN Take 40 mg by mouth daily.   chlorthalidone 25 MG tablet Commonly known as:  HYGROTON Take 12.5 mg by mouth daily.   diltiazem 240 MG 24 hr capsule Commonly known as:  CARDIZEM CD Take 240 mg by mouth daily.   gabapentin 300 MG capsule Commonly known as:  NEURONTIN Take 1 capsule (300 mg total) by mouth 3 (three) times daily.   methocarbamol 500 MG tablet Commonly known as:  ROBAXIN Take 1 tablet (500 mg total) by mouth every 6 (six) hours as needed for muscle spasms.   oxyCODONE-acetaminophen 7.5-325 MG tablet Commonly known as:  PERCOCET Take 1 tablet by mouth every  4 (four) hours as needed.      Follow-up Information    Lisbeth Renshaw, MD. Schedule an appointment as soon as possible for a visit in 3 week(s).   Specialty:  Neurosurgery Contact information: [address] [place] [address] [phone]           Signed: Alyson Ingles 09/01/2017, 3:55 PM

## 2017-09-01 NOTE — Anesthesia Preprocedure Evaluation (Addendum)
Anesthesia Evaluation  Patient identified by MRN, date of birth, ID band Patient awake    Reviewed: Allergy & Precautions, NPO status , Patient's Chart, lab work & pertinent test results  Airway Mallampati: II  TM Distance: >3 FB Neck ROM: Full    Dental no notable dental hx. (+) Dental Advisory Given   Pulmonary Current Smoker,    Pulmonary exam normal breath sounds clear to auscultation       Cardiovascular hypertension, Pt. on medications Normal cardiovascular exam Rhythm:Regular Rate:Normal     Neuro/Psych negative neurological ROS  negative psych ROS   GI/Hepatic negative GI ROS, Neg liver ROS,   Endo/Other  negative endocrine ROS  Renal/GU negative Renal ROS  negative genitourinary   Musculoskeletal negative musculoskeletal ROS (+)   Abdominal   Peds negative pediatric ROS (+)  Hematology negative hematology ROS (+)   Anesthesia Other Findings   Reproductive/Obstetrics negative OB ROS                            Anesthesia Physical Anesthesia Plan  ASA: II  Anesthesia Plan: General   Post-op Pain Management:    Induction: Intravenous  PONV Risk Score and Plan: 1 and Ondansetron and Treatment may vary due to age or medical condition  Airway Management Planned: Oral ETT  Additional Equipment:   Intra-op Plan:   Post-operative Plan: Extubation in OR  Informed Consent: I have reviewed the patients History and Physical, chart, labs and discussed the procedure including the risks, benefits and alternatives for the proposed anesthesia with the patient or authorized representative who has indicated his/her understanding and acceptance.   Dental advisory given  Plan Discussed with: CRNA  Anesthesia Plan Comments:         Anesthesia Quick Evaluation

## 2017-09-01 NOTE — Care Management Note (Signed)
Case Management Note  Patient Details  Name: Gypsy DecantRobert Piccirilli MRN: [id number] Date of Birth: 03/19/1970  Subjective/Objective:                    Action/Plan: Pt discharging home with self care. Pt has a PCP but no insurance.  He recently started a new job but benefits have not started.  CM provided him coupon for medications. Pt states he can afford the d/c meds with the coupon. CM also provided him the information on the [facility] incase he decides to switch MD and get assistance with the cost of his medications.  Wife to provide transport home and supervision at home.   Expected Discharge Date:  09/01/17               Expected Discharge Plan:  Home/Self Care  In-House Referral:     Discharge planning Services  CM Consult, Medication Assistance  Post Acute Care Choice:    Choice offered to:     DME Arranged:    DME Agency:     HH Arranged:    HH Agency:     Status of Service:  Completed, signed off  If discussed at [employer] Length of Stay Meetings, dates discussed:    Additional Comments:  Kermit BaloKelli F Marques Ericson, RN 09/01/2017, 4:37 PM

## 2017-09-01 NOTE — Anesthesia Procedure Notes (Signed)
Procedure Name: Intubation Date/Time: 09/01/2017 8:33 AM Performed by: Orlie Dakin, CRNA Pre-anesthesia Checklist: Patient identified, Emergency Drugs available, Suction available, Patient being monitored and Timeout performed Patient Re-evaluated:Patient Re-evaluated prior to induction Oxygen Delivery Method: Circle system utilized Preoxygenation: Pre-oxygenation with 100% oxygen Induction Type: IV induction Ventilation: Mask ventilation without difficulty Laryngoscope Size: Mac and 4 Grade View: Grade III Tube type: Oral Tube size: 7.5 mm Number of attempts: 1 Airway Equipment and Method: Stylet Placement Confirmation: ETT inserted through vocal cords under direct vision,  positive ETCO2 and breath sounds checked- equal and bilateral Secured at: 23 cm Tube secured with: Tape Dental Injury: Teeth and Oropharynx as per pre-operative assessment  Comments:  DL, noted large floppy epiglottis and large tongue.  4x4s bite block used.

## 2017-09-02 ENCOUNTER — Encounter (HOSPITAL_COMMUNITY): Payer: Self-pay | Admitting: Neurosurgery

## 2017-09-19 ENCOUNTER — Encounter (HOSPITAL_BASED_OUTPATIENT_CLINIC_OR_DEPARTMENT_OTHER): Payer: Self-pay | Admitting: Emergency Medicine

## 2017-09-19 ENCOUNTER — Emergency Department (HOSPITAL_BASED_OUTPATIENT_CLINIC_OR_DEPARTMENT_OTHER)
Admission: EM | Admit: 2017-09-19 | Discharge: 2017-09-19 | Disposition: A | Discharge status: Home / Self Care | Payer: Self-pay | Attending: Emergency Medicine | Admitting: Emergency Medicine

## 2017-09-19 ENCOUNTER — Other Ambulatory Visit: Payer: Self-pay

## 2017-09-19 ENCOUNTER — Emergency Department (HOSPITAL_BASED_OUTPATIENT_CLINIC_OR_DEPARTMENT_OTHER): Payer: Self-pay

## 2017-09-19 DIAGNOSIS — M7989 Other specified soft tissue disorders: Secondary | ICD-10-CM

## 2017-09-19 DIAGNOSIS — I10 Essential (primary) hypertension: Secondary | ICD-10-CM | POA: Insufficient documentation

## 2017-09-19 DIAGNOSIS — Z86718 Personal history of other venous thrombosis and embolism: Secondary | ICD-10-CM | POA: Insufficient documentation

## 2017-09-19 DIAGNOSIS — F17210 Nicotine dependence, cigarettes, uncomplicated: Secondary | ICD-10-CM | POA: Insufficient documentation

## 2017-09-19 DIAGNOSIS — Z79899 Other long term (current) drug therapy: Secondary | ICD-10-CM | POA: Insufficient documentation

## 2017-09-19 DIAGNOSIS — R2241 Localized swelling, mass and lump, right lower limb: Secondary | ICD-10-CM | POA: Insufficient documentation

## 2017-09-19 HISTORY — DX: Acute embolism and thrombosis of unspecified deep veins of unspecified lower extremity: I82.409

## 2017-09-19 LAB — BASIC METABOLIC PANEL
Anion gap: 9 (ref 5–15)
BUN: 9 mg/dL (ref 6–20)
CHLORIDE: 99 mmol/L (ref 98–111)
CO2: 29 mmol/L (ref 22–32)
Calcium: 9.2 mg/dL (ref 8.9–10.3)
Creatinine, Ser: 0.86 mg/dL (ref 0.61–1.24)
GFR calc non Af Amer: >60 mL/min (ref >60)
Glucose, Bld: 97 mg/dL (ref 70–99)
Potassium: 3.5 mmol/L (ref 3.5–5.1)
Sodium: 137 mmol/L (ref 135–145)

## 2017-09-19 LAB — URIC ACID: URIC ACID, SERUM: 8.5 mg/dL (ref 3.7–8.6)

## 2017-09-19 LAB — CBC WITH DIFFERENTIAL/PLATELET
Basophils Absolute: 0.1 10*3/uL (ref 0.0–0.1)
Basophils Relative: 1 %
Eosinophils Absolute: 0.2 10*3/uL (ref 0.0–0.7)
Eosinophils Relative: 6 %
HEMATOCRIT: 36.5 % — AB (ref 39.0–52.0)
Hemoglobin: 12.4 g/dL — ABNORMAL LOW (ref 13.0–17.0)
LYMPHS ABS: 1.3 10*3/uL (ref 0.7–4.0)
Lymphocytes Relative: 35 %
MCH: 28 pg (ref 26.0–34.0)
MCHC: 34 g/dL (ref 30.0–36.0)
MCV: 82.4 fL (ref 78.0–100.0)
MONOS PCT: 9 %
Monocytes Absolute: 0.4 10*3/uL (ref 0.1–1.0)
NEUTROS ABS: 1.8 10*3/uL (ref 1.7–7.7)
NEUTROS PCT: 49 %
Platelets: 355 10*3/uL (ref 150–400)
RBC: 4.43 MIL/uL (ref 4.22–5.81)
RDW: 12.7 % (ref 11.5–15.5)
WBC: 3.7 10*3/uL — ABNORMAL LOW (ref 4.0–10.5)

## 2017-09-19 LAB — BRAIN NATRIURETIC PEPTIDE: B Natriuretic Peptide: 7.8 pg/mL (ref 0.0–100.0)

## 2017-09-19 NOTE — ED Triage Notes (Signed)
Pt had back surgery 7/10. C/o R leg pain and swelling x 2 days.

## 2017-09-19 NOTE — ED Notes (Signed)
Patient transported to Ultrasound 

## 2017-09-19 NOTE — ED Notes (Signed)
ED Provider at bedside. 

## 2017-09-19 NOTE — Discharge Instructions (Signed)
Please follow up with PCP or return to the ED if you experience any of the following symptoms:  You develop shortness of breath or chest pain. You cannot breathe when you lie down. You develop pain, redness, or warmth in the swollen areas. You have heart, liver, or kidney disease and suddenly get edema. You have a fever and your symptoms suddenly get worse.

## 2017-09-19 NOTE — ED Provider Notes (Addendum)
[facility] EMERGENCY DEPARTMENT Provider Note   CSN: [id number] Arrival date & time: 09/19/17  4098     History   Chief Complaint Chief Complaint  Patient presents with  . Leg Swelling    HPI Ricardo Heath is a 47 y.o. male.  47 y/o male with a PMH of HTN,DVT and gout presents to the ED with a chief complaint of right leg swelling x 3 days ago. Patient had a laminectomy and discectomy 1 week ago with Dr. Conchita Paris at [facility]. Patient states he first noticed the swelling around his foot which then traveled to his ankle now to his calf region.  He describes it as a numbing feeling. Patient is not currently on any blood thinners. Patient had a previously DVT in 2015, he is concerned for reoccurrence. He denies any chest pain, shortness of breath, headache or abdominal complaints.     Past Medical History:  Diagnosis Date  . DVT (deep venous thrombosis) (HCC)   . Gout   . Hypertension     Patient Active Problem List   Diagnosis Date Noted  . Lumbar herniated disc 08/29/2017  . Right shoulder pain 06/28/2015  . Forearm contusion 06/18/2015    Past Surgical History:  Procedure Laterality Date  . LUMBAR LAMINECTOMY/DECOMPRESSION MICRODISCECTOMY N/A 09/01/2017   Procedure: Lumbar four-five Laminectomy/Microdiscectomy;  Surgeon: Lisbeth Renshaw, MD;  Location[facility] OR;  Service: Neurosurgery;  Laterality: N/A;        Home Medications    Prior to Admission medications   Medication Sig Start Date End Date Taking? Authorizing Provider  atorvastatin (LIPITOR) 10 MG tablet Take 10 mg by mouth daily.    [provider]  benazepril (LOTENSIN) 40 MG tablet Take 40 mg by mouth daily.    [provider]  chlorthalidone (HYGROTON) 25 MG tablet Take 12.5 mg by mouth daily.    [provider]  diltiazem (CARDIZEM CD) 240 MG 24 hr capsule Take 240 mg by mouth daily.    [provider]  gabapentin (NEURONTIN) 300 MG capsule Take 1  capsule (300 mg total) by mouth 3 (three) times daily. 09/01/17   Costella, Darci Current, PA-C  methocarbamol (ROBAXIN) 500 MG tablet Take 1 tablet (500 mg total) by mouth every 6 (six) hours as needed for muscle spasms. 09/01/17   Costella, Darci Current, PA-C  oxyCODONE-acetaminophen (PERCOCET) 7.5-325 MG tablet Take 1 tablet by mouth every 4 (four) hours as needed. 09/01/17   Costella, Darci Current, PA-C    Family History No family history on file.  Social History Social History   Tobacco Use  . Smoking status: Current Some Day Smoker    Types: Cigarettes  . Smokeless tobacco: Never Used  Substance Use Topics  . Alcohol use: Yes    Alcohol/week: 0.0 oz    Comment: occ  . Drug use: No     Allergies   Promethazine   Review of Systems Review of Systems  Constitutional: Negative for chills and fever.  HENT: Negative for ear pain and sore throat.   Eyes: Negative for pain and visual disturbance.  Respiratory: Negative for cough and shortness of breath.   Cardiovascular: Positive for leg swelling. Negative for chest pain and palpitations.  Gastrointestinal: Negative for abdominal pain, diarrhea, nausea and vomiting.  Genitourinary: Negative for dysuria, flank pain and hematuria.  Musculoskeletal: Positive for myalgias. Negative for arthralgias, back pain and joint swelling.  Skin: Negative for color change and rash.  Neurological: Negative for seizures, syncope, light-headedness and  headaches.  All other systems reviewed and are negative.    Physical Exam Updated Vital Signs BP 111/77 (BP Location: Right Arm)   Pulse 84   Temp 98 F (36.7 C) (Oral)   Resp 16   Ht 5\' 7"  (1.702 m)   Wt 117.9 kg (260 lb)   SpO2 100%   BMI 40.72 kg/m   Physical Exam  Constitutional: He is oriented to person, place, and time. He appears well-developed and well-nourished.  HENT:  Head: Normocephalic and atraumatic.  Mouth/Throat: Oropharynx is clear and moist.  Eyes: Pupils are equal, round,  and reactive to light. No scleral icterus.  Neck: Normal range of motion.  Cardiovascular: Normal heart sounds.  Pulses:      Radial pulses are 2+ on the right side, and 2+ on the left side.       Dorsalis pedis pulses are 2+ on the right side, and 2+ on the left side.       Posterior tibial pulses are 2+ on the right side, and 2+ on the left side.  DP & PT pulses checked with ultrasound doppler at the bedside due to swelling. Both pulses present and symmetrical.   Pulmonary/Chest: Effort normal and breath sounds normal. He has no decreased breath sounds. He has no wheezes. He exhibits no tenderness.  Abdominal: Soft. Bowel sounds are normal. He exhibits no distension. There is no tenderness.  Musculoskeletal: He exhibits no tenderness or deformity.  Neurological: He is alert and oriented to person, place, and time.  Skin: Skin is warm, dry and intact. Capillary refill takes less than 2 seconds. No abrasion and no bruising noted. No erythema.     There is moderate amount of edema from his dorsum right foot to his right calf.  DVT scan was negative.  Psychiatric: He has a normal mood and affect.  Nursing note and vitals reviewed.    ED Treatments / Results  Labs (all labs ordered are listed, but only abnormal results are displayed) Labs Reviewed  CBC WITH DIFFERENTIAL/PLATELET - Abnormal; Notable for the following components:      Result Value   WBC 3.7 (*)    Hemoglobin 12.4 (*)    HCT 36.5 (*)    All other components within normal limits  BRAIN NATRIURETIC PEPTIDE  BASIC METABOLIC PANEL  URIC ACID    EKG None  Radiology [place] Venous Img Lower Unilateral Right  Result Date: 09/19/2017 CLINICAL DATA:  Right foot and ankle pain and swelling for 3 days. EXAM: Right LOWER EXTREMITY VENOUS DOPPLER ULTRASOUND TECHNIQUE: Gray-scale sonography with graded compression, as well as color Doppler and duplex ultrasound were performed to evaluate the lower extremity deep venous systems from  the level of the common femoral vein and including the common femoral, femoral, profunda femoral, popliteal and calf veins including the posterior tibial, peroneal and gastrocnemius veins when visible. The superficial great saphenous vein was also interrogated. Spectral Doppler was utilized to evaluate flow at rest and with distal augmentation maneuvers in the common femoral, femoral and popliteal veins. COMPARISON:  None. FINDINGS: Contralateral Common Femoral Vein: Respiratory phasicity is normal and symmetric with the symptomatic side. No evidence of thrombus. Normal compressibility. Common Femoral Vein: No evidence of thrombus. Normal compressibility, respiratory phasicity and response to augmentation. Saphenofemoral Junction: No evidence of thrombus. Normal compressibility and flow on color Doppler imaging. Profunda Femoral Vein: No evidence of thrombus. Normal compressibility and flow on color Doppler imaging. Femoral Vein: No evidence of thrombus. Normal compressibility, respiratory phasicity  and response to augmentation. Popliteal Vein: No evidence of thrombus. Normal compressibility, respiratory phasicity and response to augmentation. Calf Veins: No evidence of thrombus. Normal compressibility and flow on color Doppler imaging. Superficial Great Saphenous Vein: No evidence of thrombus. Normal compressibility. Venous Reflux:  None. Other Findings:  None. IMPRESSION: No evidence of deep venous thrombosis. Electronically Signed   By: Rudie Meyer M.D.   On: 09/19/2017 11:58    Procedures Procedures (including critical care time)  Medications Ordered in ED Medications - No data to display   Initial Impression / Assessment and Plan / ED Course  I have reviewed the triage vital signs and the nursing notes.  Pertinent labs & imaging results that were available during my care of the patient were reviewed by me and considered in my medical decision making (see chart for details).     Patient is a  47 y/o s/p laminectomy and discectomy 1 week ago with a previous history of a DVT. Upon examination right leg looks swollen and warmth. Pulses are present and were reexamined with bedside doppler.There is no streaking or redness on skin, I believe cellulitis is less likely. There is no trauma, or joint pain. Patient had surgery 1 week ago, tenderness along deep venous system and has had a previous documented DVT per Wells score he is high risk will obtain DVT studies. US venous doppler of right leg showed NO DVT. CMP showed slight decrease in white count, and low Hgb and HCT, I suspect this is because patient had surgery 1 week ago. Uric acid level was 8.5, at this time I don't believe this is likely to be gout form the distribution pattern. BMP showed normal kidney function. BNP was also 7.8, making this less likely to be heart related. states he has an appointment with his PCP on Wednesday, would like to go home at this time. Return precautions have been provided.  Patient's vitals stable for discharge.  Final Clinical Impressions(s) / ED Diagnoses   Final diagnoses:  Right leg swelling    ED Discharge Orders    None       Claude Manges, PA-C 09/19/17 1333    Claude Manges, PA-C 09/19/17 1334    Pricilla Loveless, MD 09/22/17 [phone]

## 2018-04-01 ENCOUNTER — Ambulatory Visit: Payer: Self-pay | Admitting: Allergy

## 2018-04-15 ENCOUNTER — Ambulatory Visit: Payer: Self-pay | Admitting: Allergy

## 2019-05-22 ENCOUNTER — Other Ambulatory Visit: Payer: Self-pay | Admitting: Orthopedic Surgery

## 2019-06-02 NOTE — Pre-Procedure Instructions (Signed)
Ricardo Heath  06/02/2019    Your procedure is scheduled on Wednesday, April 14. 2021 at 8:30 AM.   Report to [facility] Entrance "A" Admitting Office at 6:30 AM.   Call this number if you have problems the morning of surgery: [phone]   Questions prior to day of surgery, please call [phone] between 8 & 4 PM.   Remember:  Do not eat food after midnight Tuesday, 06/06/19.  You may drink clear liquids until 5:30 AM.  Clear liquids allowed are:  Water, Juice (non-citric and without pulp), Carbonated beverages, Clear Tea, Black Coffee only, Plain Jell-O only, Gatorade and Plain Popsicles only   Drink the Pre-Surgery Ensure drink between 5:15 AM and 5:30 AM. This will be the last thing you drink prior to surgery.    Take these medicines the morning of surgery with A SIP OF WATER: Diltiazem (Cardizem), Atorvastatin (Lipitor), Tramadol (Ultram) - if needed  Do not use NSAIDS (Ibuprofen, Aleve, etc), Aspirin products (BC Powders, Goody's, etc), Multivitamins, Herbal medications or Fish Oil prior to surgery.    Do not wear jewelry.  Do not wear lotions, powders, cologne or deodorant.  Men may shave face and neck.  Do not bring valuables to the hospital.  [facility] is not responsible for any belongings or valuables.  Contacts, dentures or bridgework may not be worn into surgery.  Leave your suitcase in the car.  After surgery it may be brought to your room.  For patients admitted to the hospital, discharge time will be determined by your treatment team.  Patients discharged the day of surgery will not be allowed to drive home.   [facility] - Preparing for Surgery  Before surgery, you can play an important role.  Because skin is not sterile, your skin needs to be as free of germs as possible.  You can reduce the number of germs on you skin by washing with CHG (chlorahexidine gluconate) soap before surgery.  CHG is an antiseptic cleaner which kills germs and bonds with  the skin to continue killing germs even after washing.  Oral Hygiene is also important in reducing the risk of infection.  Remember to brush your teeth with your regular toothpaste the morning of surgery.  Please DO NOT use if you have an allergy to CHG or antibacterial soaps.  If your skin becomes reddened/irritated stop using the CHG and inform your nurse when you arrive at Short Stay.  Do not shave (including legs and underarms) for at least 48 hours prior to the first CHG shower.  You may shave your face.  Please follow these instructions carefully:   1.  Shower with CHG Soap the night before surgery and the morning of Surgery.  2.  If you choose to wash your hair, wash your hair first as usual with your normal shampoo.  3.  After you shampoo, rinse your hair and body thoroughly to remove the shampoo. 4.  Use CHG as you would any other liquid soap.  You can apply chg directly to the skin and wash gently with a      scrungie or washcloth.           5.  Apply the CHG Soap to your body ONLY FROM THE NECK DOWN.   Do not use on open wounds or open sores. Avoid contact with your eyes, ears, mouth and genitals (private parts).  Wash genitals (private parts) with your normal soap - do this prior to using CHG  soap.  6.  Wash thoroughly, paying special attention to the area where your surgery will be performed.  7.  Thoroughly rinse your body with warm water from the neck down.  8.  DO NOT shower/wash with your normal soap after using and rinsing off the CHG Soap.  9.  Pat yourself dry with a clean towel.            10.  Wear clean pajamas.            11.  Place clean sheets on your bed the night of your first shower and do not sleep with pets.  Day of Surgery  Shower as above. Do not apply any lotions/deoderants the morning of surgery.   Please wear clean clothes to the hospital/surgery center. Remember to brush your teeth with toothpaste.   Please read over the fact sheets that you were  given.

## 2019-06-05 ENCOUNTER — Other Ambulatory Visit: Payer: Self-pay

## 2019-06-05 ENCOUNTER — Encounter (HOSPITAL_COMMUNITY)
Admission: RE | Admit: 2019-06-05 | Discharge: 2019-06-05 | Disposition: A | Discharge status: Home / Self Care | Payer: 59 | Source: Ambulatory Visit | Attending: Orthopedic Surgery | Admitting: Orthopedic Surgery

## 2019-06-05 ENCOUNTER — Encounter (HOSPITAL_COMMUNITY): Payer: Self-pay

## 2019-06-05 ENCOUNTER — Other Ambulatory Visit (HOSPITAL_COMMUNITY)
Admission: RE | Admit: 2019-06-05 | Discharge: 2019-06-05 | Disposition: A | Discharge status: Home / Self Care | Payer: 59 | Source: Ambulatory Visit | Attending: Orthopedic Surgery | Admitting: Orthopedic Surgery

## 2019-06-05 DIAGNOSIS — Z01810 Encounter for preprocedural cardiovascular examination: Secondary | ICD-10-CM | POA: Diagnosis present

## 2019-06-05 DIAGNOSIS — R9431 Abnormal electrocardiogram [ECG] [EKG]: Secondary | ICD-10-CM | POA: Insufficient documentation

## 2019-06-05 DIAGNOSIS — Z01812 Encounter for preprocedural laboratory examination: Secondary | ICD-10-CM | POA: Diagnosis not present

## 2019-06-05 DIAGNOSIS — Z20822 Contact with and (suspected) exposure to covid-19: Secondary | ICD-10-CM | POA: Diagnosis not present

## 2019-06-05 HISTORY — DX: Other complications of anesthesia, initial encounter: T88.59XA

## 2019-06-05 HISTORY — DX: Peripheral vascular disease, unspecified: I73.9

## 2019-06-05 HISTORY — DX: Gastro-esophageal reflux disease without esophagitis: K21.9

## 2019-06-05 HISTORY — DX: Sleep apnea, unspecified: G47.30

## 2019-06-05 HISTORY — DX: Pneumonia, unspecified organism: J18.9

## 2019-06-05 LAB — CBC WITH DIFFERENTIAL/PLATELET
Abs Immature Granulocytes: 0.01 10*3/uL (ref 0.00–0.07)
Basophils Absolute: 0.1 10*3/uL (ref 0.0–0.1)
Basophils Relative: 1 %
Eosinophils Absolute: 0.3 10*3/uL (ref 0.0–0.5)
Eosinophils Relative: 5 %
HCT: 42.6 % (ref 39.0–52.0)
Hemoglobin: 14.2 g/dL (ref 13.0–17.0)
Immature Granulocytes: 0 %
Lymphocytes Relative: 37 %
Lymphs Abs: 1.9 10*3/uL (ref 0.7–4.0)
MCH: 28.3 pg (ref 26.0–34.0)
MCHC: 33.3 g/dL (ref 30.0–36.0)
MCV: 85 fL (ref 80.0–100.0)
Monocytes Absolute: 0.4 10*3/uL (ref 0.1–1.0)
Monocytes Relative: 8 %
Neutro Abs: 2.5 10*3/uL (ref 1.7–7.7)
Neutrophils Relative %: 49 %
Platelets: 226 10*3/uL (ref 150–400)
RBC: 5.01 MIL/uL (ref 4.22–5.81)
RDW: 13 % (ref 11.5–15.5)
WBC: 5.1 10*3/uL (ref 4.0–10.5)
nRBC: 0 % (ref 0.0–0.2)

## 2019-06-05 LAB — TYPE AND SCREEN
ABO/RH(D): O POS
Antibody Screen: NEGATIVE

## 2019-06-05 LAB — URINALYSIS, ROUTINE W REFLEX MICROSCOPIC
Bacteria, UA: NONE SEEN
Bilirubin Urine: NEGATIVE
Glucose, UA: NEGATIVE mg/dL
Ketones, ur: NEGATIVE mg/dL
Leukocytes,Ua: NEGATIVE
Nitrite: NEGATIVE
Protein, ur: NEGATIVE mg/dL
Specific Gravity, Urine: 1.006 (ref 1.005–1.030)
pH: 6 (ref 5.0–8.0)

## 2019-06-05 LAB — COMPREHENSIVE METABOLIC PANEL
ALT: 37 U/L (ref 0–44)
AST: 30 U/L (ref 15–41)
Albumin: 4.2 g/dL (ref 3.5–5.0)
Alkaline Phosphatase: 90 U/L (ref 38–126)
Anion gap: 12 (ref 5–15)
BUN: 8 mg/dL (ref 6–20)
CO2: 23 mmol/L (ref 22–32)
Calcium: 9.5 mg/dL (ref 8.9–10.3)
Chloride: 103 mmol/L (ref 98–111)
Creatinine, Ser: 0.87 mg/dL (ref 0.61–1.24)
GFR calc Af Amer: >60 mL/min (ref >60)
GFR calc non Af Amer: >60 mL/min (ref >60)
Glucose, Bld: 101 mg/dL — ABNORMAL HIGH (ref 70–99)
Potassium: 3.5 mmol/L (ref 3.5–5.1)
Sodium: 138 mmol/L (ref 135–145)
Total Bilirubin: 0.6 mg/dL (ref 0.3–1.2)
Total Protein: 7.7 g/dL (ref 6.5–8.1)

## 2019-06-05 LAB — SURGICAL PCR SCREEN
MRSA, PCR: NEGATIVE
Staphylococcus aureus: NEGATIVE

## 2019-06-05 LAB — ABO/RH: ABO/RH(D): O POS

## 2019-06-05 LAB — PROTIME-INR
INR: 1 (ref 0.8–1.2)
Prothrombin Time: 13 seconds (ref 11.4–15.2)

## 2019-06-05 LAB — APTT: aPTT: 30 seconds (ref 24–36)

## 2019-06-05 LAB — SARS CORONAVIRUS 2 (TAT 6-24 HRS): SARS Coronavirus 2: NEGATIVE

## 2019-06-05 NOTE — Progress Notes (Signed)
PCP - Misty Stanley, PA  Chest x-ray - N/A EKG - today Stress Test - denies ECHO - denies Cardiac Cath - denies  Sleep Study - "years ago" at [facility] CPAP - yes  Blood Thinner Instructions: N/A Aspirin Instructions: N/A  ERAS Protcol - yes PRE-SURGERY Ensure or G2- Ensure  COVID TEST- today   Anesthesia review: No  Patient denies shortness of breath, fever, cough and chest pain at PAT appointment   All instructions explained to the patient, with a verbal understanding of the material. Patient agrees to go over the instructions while at home for a better understanding. Patient also instructed to self quarantine after being tested for COVID-19. The opportunity to ask questions was provided.

## 2019-06-06 MED ORDER — DEXTROSE 5 % IV SOLN
3.0000 g | INTRAVENOUS | Status: AC
  Administered 2019-06-07: 3 g via INTRAVENOUS
  Filled 2019-06-06: qty 3

## 2019-06-07 ENCOUNTER — Encounter (HOSPITAL_COMMUNITY)
Admission: RE | Disposition: A | Discharge status: Home / Self Care | Payer: Self-pay | Source: Home / Self Care | Attending: Orthopedic Surgery

## 2019-06-07 ENCOUNTER — Ambulatory Visit (HOSPITAL_COMMUNITY): Payer: 59 | Admitting: Certified Registered Nurse Anesthetist

## 2019-06-07 ENCOUNTER — Observation Stay (HOSPITAL_COMMUNITY)
Admission: RE | Admit: 2019-06-07 | Discharge: 2019-06-08 | Disposition: A | Discharge status: Home / Self Care | Payer: 59 | Attending: Orthopedic Surgery | Admitting: Orthopedic Surgery

## 2019-06-07 ENCOUNTER — Encounter (HOSPITAL_COMMUNITY): Payer: Self-pay | Admitting: Orthopedic Surgery

## 2019-06-07 ENCOUNTER — Other Ambulatory Visit: Payer: Self-pay

## 2019-06-07 ENCOUNTER — Ambulatory Visit (HOSPITAL_COMMUNITY): Payer: 59

## 2019-06-07 DIAGNOSIS — Z86718 Personal history of other venous thrombosis and embolism: Secondary | ICD-10-CM | POA: Insufficient documentation

## 2019-06-07 DIAGNOSIS — M5410 Radiculopathy, site unspecified: Secondary | ICD-10-CM | POA: Diagnosis present

## 2019-06-07 DIAGNOSIS — K219 Gastro-esophageal reflux disease without esophagitis: Secondary | ICD-10-CM | POA: Diagnosis not present

## 2019-06-07 DIAGNOSIS — G9529 Other cord compression: Secondary | ICD-10-CM | POA: Diagnosis not present

## 2019-06-07 DIAGNOSIS — I10 Essential (primary) hypertension: Secondary | ICD-10-CM | POA: Insufficient documentation

## 2019-06-07 DIAGNOSIS — Z79899 Other long term (current) drug therapy: Secondary | ICD-10-CM | POA: Diagnosis not present

## 2019-06-07 DIAGNOSIS — Z6841 Body Mass Index (BMI) 40.0 and over, adult: Secondary | ICD-10-CM | POA: Diagnosis not present

## 2019-06-07 DIAGNOSIS — M4802 Spinal stenosis, cervical region: Secondary | ICD-10-CM | POA: Diagnosis not present

## 2019-06-07 DIAGNOSIS — I739 Peripheral vascular disease, unspecified: Secondary | ICD-10-CM | POA: Insufficient documentation

## 2019-06-07 DIAGNOSIS — G4730 Sleep apnea, unspecified: Secondary | ICD-10-CM | POA: Insufficient documentation

## 2019-06-07 DIAGNOSIS — F17210 Nicotine dependence, cigarettes, uncomplicated: Secondary | ICD-10-CM | POA: Insufficient documentation

## 2019-06-07 DIAGNOSIS — Z419 Encounter for procedure for purposes other than remedying health state, unspecified: Secondary | ICD-10-CM

## 2019-06-07 HISTORY — PX: ANTERIOR CERVICAL DECOMPRESSION/DISCECTOMY FUSION 4 LEVELS: SHX5556

## 2019-06-07 SURGERY — ANTERIOR CERVICAL DECOMPRESSION/DISCECTOMY FUSION 4 LEVELS
Anesthesia: General

## 2019-06-07 MED ORDER — PROPOFOL 10 MG/ML IV BOLUS
INTRAVENOUS | Status: DC | PRN
  Administered 2019-06-07: 200 mg via INTRAVENOUS

## 2019-06-07 MED ORDER — LIDOCAINE 2% (20 MG/ML) 5 ML SYRINGE
INTRAMUSCULAR | Status: DC | PRN
  Administered 2019-06-07: 100 mg via INTRAVENOUS

## 2019-06-07 MED ORDER — SODIUM CHLORIDE 0.9% FLUSH: 3.0000 mL | INTRAVENOUS | Status: DC | PRN

## 2019-06-07 MED ORDER — FENTANYL CITRATE (PF) 250 MCG/5ML IJ SOLN
INTRAMUSCULAR | Status: AC
  Filled 2019-06-07: qty 5

## 2019-06-07 MED ORDER — PROPOFOL 10 MG/ML IV BOLUS
INTRAVENOUS | Status: AC
  Filled 2019-06-07: qty 20

## 2019-06-07 MED ORDER — ALUM & MAG HYDROXIDE-SIMETH 200-200-20 MG/5ML PO SUSP: 30.0000 mL | Freq: Four times a day (QID) | ORAL | Status: DC | PRN

## 2019-06-07 MED ORDER — DILTIAZEM HCL ER COATED BEADS 120 MG PO CP24: 240.0000 mg | ORAL_CAPSULE | Freq: Every day | ORAL | Status: DC

## 2019-06-07 MED ORDER — GLYCOPYRROLATE 0.2 MG/ML IJ SOLN
INTRAMUSCULAR | Status: DC | PRN
  Administered 2019-06-07 (×2): .1 mg via INTRAVENOUS

## 2019-06-07 MED ORDER — PANTOPRAZOLE SODIUM 40 MG IV SOLR: 40.0000 mg | Freq: Every day | INTRAVENOUS | Status: DC

## 2019-06-07 MED ORDER — EPINEPHRINE PF 1 MG/ML IJ SOLN
INTRAMUSCULAR | Status: AC
  Filled 2019-06-07: qty 1

## 2019-06-07 MED ORDER — METHOCARBAMOL 1000 MG/10ML IJ SOLN
500.0000 mg | Freq: Four times a day (QID) | INTRAVENOUS | Status: DC | PRN
  Filled 2019-06-07: qty 5

## 2019-06-07 MED ORDER — BISACODYL 5 MG PO TBEC: 5.0000 mg | DELAYED_RELEASE_TABLET | Freq: Every day | ORAL | Status: DC | PRN

## 2019-06-07 MED ORDER — ATORVASTATIN CALCIUM 10 MG PO TABS: 10.0000 mg | ORAL_TABLET | Freq: Every day | ORAL | Status: DC

## 2019-06-07 MED ORDER — METHOCARBAMOL 500 MG PO TABS
500.0000 mg | ORAL_TABLET | Freq: Four times a day (QID) | ORAL | Status: DC | PRN
  Administered 2019-06-07 – 2019-06-08 (×3): 500 mg via ORAL
  Filled 2019-06-07 (×2): qty 1

## 2019-06-07 MED ORDER — SODIUM CHLORIDE 0.9 % IV SOLN: 250.0000 mL | INTRAVENOUS | Status: DC

## 2019-06-07 MED ORDER — PHENYLEPHRINE 40 MCG/ML (10ML) SYRINGE FOR IV PUSH (FOR BLOOD PRESSURE SUPPORT)
PREFILLED_SYRINGE | INTRAVENOUS | Status: AC
  Filled 2019-06-07: qty 10

## 2019-06-07 MED ORDER — ACETAMINOPHEN 650 MG RE SUPP: 650.0000 mg | RECTAL | Status: DC | PRN

## 2019-06-07 MED ORDER — ROCURONIUM BROMIDE 50 MG/5ML IV SOSY
PREFILLED_SYRINGE | INTRAVENOUS | Status: DC | PRN
  Administered 2019-06-07: 20 mg via INTRAVENOUS
  Administered 2019-06-07: 80 mg via INTRAVENOUS

## 2019-06-07 MED ORDER — ZOLPIDEM TARTRATE 5 MG PO TABS: 5.0000 mg | ORAL_TABLET | Freq: Every evening | ORAL | Status: DC | PRN

## 2019-06-07 MED ORDER — ACETAMINOPHEN 325 MG PO TABS: 650.0000 mg | ORAL_TABLET | ORAL | Status: DC | PRN

## 2019-06-07 MED ORDER — HYDROMORPHONE HCL 1 MG/ML IJ SOLN
INTRAMUSCULAR | Status: AC
  Administered 2019-06-07: 13:00:00 0.5 mg via INTRAVENOUS
  Filled 2019-06-07: qty 1

## 2019-06-07 MED ORDER — THROMBIN (RECOMBINANT) 20000 UNITS EX SOLR
CUTANEOUS | Status: AC
  Filled 2019-06-07: qty 20000

## 2019-06-07 MED ORDER — ACETAMINOPHEN 10 MG/ML IV SOLN: 1000.0000 mg | Freq: Once | INTRAVENOUS | Status: DC | PRN

## 2019-06-07 MED ORDER — MIDAZOLAM HCL 2 MG/2ML IJ SOLN
INTRAMUSCULAR | Status: AC
  Filled 2019-06-07: qty 2

## 2019-06-07 MED ORDER — 0.9 % SODIUM CHLORIDE (POUR BTL) OPTIME
TOPICAL | Status: DC | PRN
  Administered 2019-06-07: 1000 mL

## 2019-06-07 MED ORDER — METHOCARBAMOL 500 MG PO TABS
ORAL_TABLET | ORAL | Status: AC
  Filled 2019-06-07: qty 1

## 2019-06-07 MED ORDER — MENTHOL 3 MG MT LOZG
1.0000 | LOZENGE | OROMUCOSAL | Status: DC | PRN
  Filled 2019-06-07 (×3): qty 9

## 2019-06-07 MED ORDER — HYDROMORPHONE HCL 1 MG/ML IJ SOLN
INTRAMUSCULAR | Status: AC
  Administered 2019-06-07: 0.5 mg via INTRAVENOUS
  Filled 2019-06-07: qty 1

## 2019-06-07 MED ORDER — HYDROMORPHONE HCL 1 MG/ML IJ SOLN
0.2500 mg | INTRAMUSCULAR | Status: DC | PRN
  Administered 2019-06-07 (×2): 0.5 mg via INTRAVENOUS

## 2019-06-07 MED ORDER — SUCCINYLCHOLINE CHLORIDE 20 MG/ML IJ SOLN
INTRAMUSCULAR | Status: DC | PRN
  Administered 2019-06-07: 160 mg via INTRAVENOUS

## 2019-06-07 MED ORDER — MORPHINE SULFATE (PF) 2 MG/ML IV SOLN
2.0000 mg | INTRAVENOUS | Status: DC | PRN
  Administered 2019-06-07 (×2): 2 mg via INTRAVENOUS
  Filled 2019-06-07: qty 1

## 2019-06-07 MED ORDER — SUGAMMADEX SODIUM 200 MG/2ML IV SOLN
INTRAVENOUS | Status: DC | PRN
  Administered 2019-06-07: 300 mg via INTRAVENOUS

## 2019-06-07 MED ORDER — ONDANSETRON HCL 4 MG PO TABS: 4.0000 mg | ORAL_TABLET | Freq: Four times a day (QID) | ORAL | Status: DC | PRN

## 2019-06-07 MED ORDER — SENNOSIDES-DOCUSATE SODIUM 8.6-50 MG PO TABS: 1.0000 | ORAL_TABLET | Freq: Every evening | ORAL | Status: DC | PRN

## 2019-06-07 MED ORDER — PHENYLEPHRINE HCL-NACL 10-0.9 MG/250ML-% IV SOLN
INTRAVENOUS | Status: DC | PRN
  Administered 2019-06-07: 25 ug/min via INTRAVENOUS

## 2019-06-07 MED ORDER — LACTATED RINGERS IV SOLN: INTRAVENOUS | Status: DC | PRN

## 2019-06-07 MED ORDER — PANTOPRAZOLE SODIUM 40 MG PO TBEC
40.0000 mg | DELAYED_RELEASE_TABLET | Freq: Every day | ORAL | Status: DC
  Administered 2019-06-07: 40 mg via ORAL
  Filled 2019-06-07: qty 1

## 2019-06-07 MED ORDER — PHENOL 1.4 % MT LIQD
1.0000 | OROMUCOSAL | Status: DC | PRN
  Administered 2019-06-07: 1 via OROMUCOSAL
  Filled 2019-06-07: qty 177

## 2019-06-07 MED ORDER — OXYCODONE-ACETAMINOPHEN 5-325 MG PO TABS
1.0000 | ORAL_TABLET | ORAL | Status: DC | PRN
  Administered 2019-06-07 – 2019-06-08 (×5): 2 via ORAL
  Filled 2019-06-07 (×5): qty 2

## 2019-06-07 MED ORDER — DEXAMETHASONE SODIUM PHOSPHATE 10 MG/ML IJ SOLN
INTRAMUSCULAR | Status: DC | PRN
  Administered 2019-06-07: 10 mg via INTRAVENOUS

## 2019-06-07 MED ORDER — CHLORTHALIDONE 25 MG PO TABS
12.5000 mg | ORAL_TABLET | Freq: Every day | ORAL | Status: DC
  Administered 2019-06-07: 16:00:00 12.5 mg via ORAL
  Filled 2019-06-07 (×2): qty 0.5

## 2019-06-07 MED ORDER — DOCUSATE SODIUM 100 MG PO CAPS
100.0000 mg | ORAL_CAPSULE | Freq: Two times a day (BID) | ORAL | Status: DC
  Administered 2019-06-07 (×2): 100 mg via ORAL
  Filled 2019-06-07 (×2): qty 1

## 2019-06-07 MED ORDER — ONDANSETRON HCL 4 MG/2ML IJ SOLN
INTRAMUSCULAR | Status: AC
  Filled 2019-06-07: qty 2

## 2019-06-07 MED ORDER — ONDANSETRON HCL 4 MG/2ML IJ SOLN: 4.0000 mg | Freq: Four times a day (QID) | INTRAMUSCULAR | Status: DC | PRN

## 2019-06-07 MED ORDER — POVIDONE-IODINE 7.5 % EX SOLN: Freq: Once | CUTANEOUS | Status: DC

## 2019-06-07 MED ORDER — LABETALOL HCL 5 MG/ML IV SOLN
INTRAVENOUS | Status: AC
  Filled 2019-06-07: qty 4

## 2019-06-07 MED ORDER — GLYCOPYRROLATE PF 0.2 MG/ML IJ SOSY
PREFILLED_SYRINGE | INTRAMUSCULAR | Status: AC
  Filled 2019-06-07: qty 1

## 2019-06-07 MED ORDER — DOXYCYCLINE HYCLATE 100 MG PO TABS
100.0000 mg | ORAL_TABLET | Freq: Two times a day (BID) | ORAL | Status: DC
  Administered 2019-06-07 (×2): 100 mg via ORAL
  Filled 2019-06-07 (×3): qty 1

## 2019-06-07 MED ORDER — CEFAZOLIN SODIUM-DEXTROSE 2-4 GM/100ML-% IV SOLN
2.0000 g | Freq: Three times a day (TID) | INTRAVENOUS | Status: AC
  Administered 2019-06-07 – 2019-06-08 (×2): 2 g via INTRAVENOUS
  Filled 2019-06-07 (×2): qty 100

## 2019-06-07 MED ORDER — FLEET ENEMA 7-19 GM/118ML RE ENEM: 1.0000 | ENEMA | Freq: Once | RECTAL | Status: DC | PRN

## 2019-06-07 MED ORDER — BUPIVACAINE-EPINEPHRINE 0.25% -1:200000 IJ SOLN
INTRAMUSCULAR | Status: DC | PRN
  Administered 2019-06-07: 5 mL

## 2019-06-07 MED ORDER — BUPIVACAINE HCL (PF) 0.25 % IJ SOLN
INTRAMUSCULAR | Status: AC
  Filled 2019-06-07: qty 30

## 2019-06-07 MED ORDER — FENTANYL CITRATE (PF) 100 MCG/2ML IJ SOLN
INTRAMUSCULAR | Status: DC | PRN
  Administered 2019-06-07: 50 ug via INTRAVENOUS
  Administered 2019-06-07: 150 ug via INTRAVENOUS
  Administered 2019-06-07: 50 ug via INTRAVENOUS
  Administered 2019-06-07: 100 ug via INTRAVENOUS
  Administered 2019-06-07: 50 ug via INTRAVENOUS
  Administered 2019-06-07: 100 ug via INTRAVENOUS

## 2019-06-07 MED ORDER — SODIUM CHLORIDE 0.9% FLUSH
3.0000 mL | Freq: Two times a day (BID) | INTRAVENOUS | Status: DC
  Administered 2019-06-07: 3 mL via INTRAVENOUS

## 2019-06-07 MED ORDER — BENAZEPRIL HCL 40 MG PO TABS
40.0000 mg | ORAL_TABLET | Freq: Every day | ORAL | Status: DC
  Administered 2019-06-07: 16:00:00 40 mg via ORAL
  Filled 2019-06-07 (×2): qty 1

## 2019-06-07 MED ORDER — MIDAZOLAM HCL 5 MG/5ML IJ SOLN
INTRAMUSCULAR | Status: DC | PRN
  Administered 2019-06-07: 2 mg via INTRAVENOUS

## 2019-06-07 MED ORDER — ONDANSETRON HCL 4 MG/2ML IJ SOLN
INTRAMUSCULAR | Status: DC | PRN
  Administered 2019-06-07: 4 mg via INTRAVENOUS

## 2019-06-07 SURGICAL SUPPLY — 65 items
BENZOIN TINCTURE PRP APPL 2/3 (GAUZE/BANDAGES/DRESSINGS) ×3 IMPLANT
BIT DRILL NEURO 2X3.1 SFT TUCH (MISCELLANEOUS) ×1 IMPLANT
BLADE CLIPPER SURG (BLADE) ×3 IMPLANT
BLADE SURG 15 STRL LF DISP TIS (BLADE) ×1 IMPLANT
BLADE SURG 15 STRL SS (BLADE) ×2
BONE VIVIGEN FORMABLE 5.4CC (Bone Implant) ×3 IMPLANT
BUR MATCHSTICK NEURO 3.0 LAGG (BURR) ×3 IMPLANT
CARTRIDGE OIL MAESTRO DRILL (MISCELLANEOUS) ×1 IMPLANT
CLOSURE WOUND 1/2 X4 (GAUZE/BANDAGES/DRESSINGS) ×1
COVER SURGICAL LIGHT HANDLE (MISCELLANEOUS) ×3 IMPLANT
DIFFUSER DRILL AIR PNEUMATIC (MISCELLANEOUS) ×3 IMPLANT
DRAPE C-ARM 42X72 X-RAY (DRAPES) ×3 IMPLANT
DRAPE POUCH INSTRU U-SHP 10X18 (DRAPES) ×3 IMPLANT
DRAPE SURG 17X23 STRL (DRAPES) ×9 IMPLANT
DRILL NEURO 2X3.1 SOFT TOUCH (MISCELLANEOUS) ×3
DURAPREP 26ML APPLICATOR (WOUND CARE) ×3 IMPLANT
ELECT COATED BLADE 2.86 ST (ELECTRODE) ×3 IMPLANT
ELECT REM PT RETURN 9FT ADLT (ELECTROSURGICAL) ×3
ELECTRODE REM PT RTRN 9FT ADLT (ELECTROSURGICAL) ×1 IMPLANT
GAUZE SPONGE 4X4 12PLY STRL (GAUZE/BANDAGES/DRESSINGS) ×3 IMPLANT
GLOVE BIO SURGEON STRL SZ7 (GLOVE) ×3 IMPLANT
GLOVE BIO SURGEON STRL SZ8 (GLOVE) ×3 IMPLANT
GLOVE BIOGEL PI IND STRL 7.0 (GLOVE) ×2 IMPLANT
GLOVE BIOGEL PI IND STRL 8 (GLOVE) ×1 IMPLANT
GLOVE BIOGEL PI INDICATOR 7.0 (GLOVE) ×4
GLOVE BIOGEL PI INDICATOR 8 (GLOVE) ×2
GOWN STRL REUS W/ TWL LRG LVL3 (GOWN DISPOSABLE) ×1 IMPLANT
GOWN STRL REUS W/ TWL XL LVL3 (GOWN DISPOSABLE) ×1 IMPLANT
GOWN STRL REUS W/TWL LRG LVL3 (GOWN DISPOSABLE) ×2
GOWN STRL REUS W/TWL XL LVL3 (GOWN DISPOSABLE) ×2
INTERLOCK LRDTC CRVCL VBR 6MM (Bone Implant) ×1 IMPLANT
INTERLOCK LRDTC CRVCL VBR 7MM (Bone Implant) ×1 IMPLANT
INTERLOCK LRDTC CRVCL VBR SM (Bone Implant) ×1 IMPLANT
IV CATH 14GX2 1/4 (CATHETERS) ×3 IMPLANT
KIT BASIN OR (CUSTOM PROCEDURE TRAY) ×3 IMPLANT
KIT TURNOVER KIT B (KITS) ×3 IMPLANT
LORDOTIC CERVICAL VBR 6MM SM (Bone Implant) ×3 IMPLANT
LORDOTIC CERVICAL VBR 7MM SM (Bone Implant) ×3 IMPLANT
LORDOTIC CERVICAL VBR SM 5MM (Bone Implant) ×3 IMPLANT
MANIFOLD NEPTUNE II (INSTRUMENTS) ×3 IMPLANT
NEEDLE SPNL 20GX3.5 QUINCKE YW (NEEDLE) ×3 IMPLANT
NS IRRIG 1000ML POUR BTL (IV SOLUTION) ×3 IMPLANT
OIL CARTRIDGE MAESTRO DRILL (MISCELLANEOUS) ×3
PACK ORTHO CERVICAL (CUSTOM PROCEDURE TRAY) ×3 IMPLANT
PAD ARMBOARD 7.5X6 YLW CONV (MISCELLANEOUS) ×6 IMPLANT
PIN DISTRACTION 14 (PIN) ×3 IMPLANT
PLATE SKYLINE THREE LEVEL 51MM (Plate) ×3 IMPLANT
POSITIONER HEAD DONUT 9IN (MISCELLANEOUS) ×3 IMPLANT
SCREW SKYLINE VAR OS 14MM (Screw) ×18 IMPLANT
SCREW SKYLINE VARIABLE LG (Screw) ×3 IMPLANT
SCREW VAR SELF TAP SKYLINE 14M (Screw) ×3 IMPLANT
SPONGE INTESTINAL PEANUT (DISPOSABLE) ×6 IMPLANT
SPONGE SURGIFOAM ABS GEL 100 (HEMOSTASIS) ×3 IMPLANT
STRIP CLOSURE SKIN 1/2X4 (GAUZE/BANDAGES/DRESSINGS) ×2 IMPLANT
SURGIFLO W/THROMBIN 8M KIT (HEMOSTASIS) ×3 IMPLANT
SUT MNCRL AB 4-0 PS2 18 (SUTURE) ×3 IMPLANT
SUT VIC AB 2-0 CT2 18 VCP726D (SUTURE) ×3 IMPLANT
SYR BULB IRRIGATION 50ML (SYRINGE) ×3 IMPLANT
SYR CONTROL 10ML LL (SYRINGE) ×6 IMPLANT
TAPE CLOTH 4X10 WHT NS (GAUZE/BANDAGES/DRESSINGS) ×3 IMPLANT
TAPE UMBILICAL COTTON 1/8X30 (MISCELLANEOUS) ×3 IMPLANT
TOWEL GREEN STERILE (TOWEL DISPOSABLE) ×3 IMPLANT
TOWEL GREEN STERILE FF (TOWEL DISPOSABLE) ×3 IMPLANT
WATER STERILE IRR 1000ML POUR (IV SOLUTION) ×3 IMPLANT
YANKAUER SUCT BULB TIP NO VENT (SUCTIONS) ×3 IMPLANT

## 2019-06-07 NOTE — Anesthesia Postprocedure Evaluation (Signed)
Anesthesia Post Note  Patient: Vermon Grays  Procedure(s) Performed: ANTERIOR CERVICAL DECOMPRESSION FUSION CERVICAL 4-5, CERVICAL 5-6, CERVICAL 6-7 WITH INSTRUMENTATION AND ALLOGRAFT (N/A )     Patient location during evaluation: PACU Anesthesia Type: General Level of consciousness: awake and alert Pain management: pain level controlled Vital Signs Assessment: post-procedure vital signs reviewed and stable Respiratory status: spontaneous breathing, nonlabored ventilation, respiratory function stable and patient connected to nasal cannula oxygen Cardiovascular status: blood pressure returned to baseline and stable Postop Assessment: no apparent nausea or vomiting Anesthetic complications: no    Last Vitals:  Vitals:   06/07/19 1333 06/07/19 1522  BP: (!) 170/101 (!) 154/88  Pulse: (!) 102 98  Resp: 18 17  Temp: 36.9 C 36.9 C  SpO2: 97% 99%    Last Pain:  Vitals:   06/07/19 1540  TempSrc:   PainSc: 7                  Nao Linz S

## 2019-06-07 NOTE — Anesthesia Procedure Notes (Signed)
Procedure Name: Intubation Date/Time: 06/07/2019 8:43 AM Performed by: Inda Coke, CRNA Pre-anesthesia Checklist: Patient identified, Emergency Drugs available, Suction available and Patient being monitored Patient Re-evaluated:Patient Re-evaluated prior to induction Oxygen Delivery Method: Circle System Utilized Preoxygenation: Pre-oxygenation with 100% oxygen Induction Type: IV induction and Rapid sequence Ventilation: Mask ventilation without difficulty and Oral airway inserted - appropriate to patient size Laryngoscope Size: Glidescope and 4 Grade View: Grade I Tube type: Oral Number of attempts: 1 Airway Equipment and Method: Stylet,  Oral airway,  Rigid stylet and Video-laryngoscopy Placement Confirmation: ETT inserted through vocal cords under direct vision,  positive ETCO2 and breath sounds checked- equal and bilateral Secured at: 23 cm Tube secured with: Tape Dental Injury: Teeth and Oropharynx as per pre-operative assessment  Comments: Elective glidescope intubation with glidescope MAC 4 blade and grade I view with smooth intubation

## 2019-06-07 NOTE — Op Note (Signed)
PATIENT NAME: Ricardo Heath   MEDICAL RECORD NO.:   [id number]    DATE OF BIRTH: 20-May-1970   DATE OF PROCEDURE: 06/07/2019                               OPERATIVE REPORT     PREOPERATIVE DIAGNOSES: 1.  Progressive cervical myelopathy 2.  Spinal stenosis, including spinal cord compression spanning C4-C7.   POSTOPERATIVE DIAGNOSES: 1.  Progressive cervical myelopathy 2.  Spinal stenosis, including spinal cord compression spanning C4-C7.   PROCEDURE: 1. Anterior cervical decompression and fusion C4/5, C5/6, C6/7. 2. Placement of anterior instrumentation, C4-C7. 3. Insertion of interbody device x 3 (Titan intervertebral spacers). 4. Intraoperative use of fluoroscopy. 5. Use of morselized allograft - ViviGen.   SURGEON:  Estill Bamberg, MD   ASSISTANT:  Jason Coop, PA-C.   ANESTHESIA:  General endotracheal anesthesia.   COMPLICATIONS:  None.   DISPOSITION:  Stable.   ESTIMATED BLOOD LOSS:  Minimal.   INDICATIONS FOR SURGERY:  Briefly, Mr. Ricardo Heath is a pleasant 49 y.o. year- old male, who did present to me progressive upper extremity weakness, balance deterioration, and bilateral hand numbness and tingling. The patient's MRI did reveal the findings noted above, particular notable for spinal cord compression from C4-C7.  Given the patient's progressive cervical myelopathy and spinal cord compression, we did discuss proceeding with the procedure noted above.  The patient was fully aware of the risks and limitations of surgery as outlined in my preoperative note.   OPERATIVE DETAILS:  On 06/07/2019, the patient was brought to surgery and general endotracheal anesthesia was administered.  The patient was placed supine on the hospital bed. The neck was gently extended.  All bony prominences were meticulously padded.  The neck was prepped and draped in the usual sterile fashion.  At this point, I did make a left-sided transverse incision.  The platysma was incised.  A Smith-Robinson  approach was used and the anterior spine was identified. A self-retaining retractor was placed.  I then subperiosteally exposed the vertebral bodies from C4-C7.  Caspar pins were then placed into the C6 and C7 vertebral bodies and distraction was applied.  A thorough and complete C6-7 intervertebral diskectomy was performed.  The posterior longitudinal ligament was identified and entered using a nerve hook.  I then used #1 followed by #2 Kerrison to perform a thorough and complete intervertebral diskectomy.  The spinal canal was thoroughly decompressed, as was the right and left neuroforamen.  The endplates were then prepared and the appropriate-sized intervertebral spacer was then packed with ViviGen and tamped into position in the usual fashion.  The lower Caspar pin was then removed and placed into the C5 vertebral body and once again, distraction was applied across the C5-6 intervertebral space.  I then again performed a thorough and complete diskectomy, thoroughly decompressing the spinal canal and bilateral neuroforamena.  After preparing the endplates, the appropriate-sized intervertebral spacer was packed with ViviGen and tamped into position.  The lower Caspar pin was then removed and placed into the C4 vertebral body and once again, distraction was applied across the C4-5 intervertebral space.  I then again performed a thorough and complete diskectomy, thoroughly decompressing the spinal canal and bilateral neuroforamena.  After preparing the endplates, the appropriate-sized intervertebral spacer was packed with ViviGen and tamped into position.  The Caspar pins then were removed and bone wax was placed in their place.  The appropriate-sized anterior cervical plate  was placed over the anterior spine.  14 mm variable angle screws were placed, 2 in each vertebral body from C4-C7 for a total of 8 vertebral body screws.  The screws were then locked to the plate using the Cam locking  mechanism.  I was very pleased with the final fluoroscopic images.  The wound was then irrigated.  The wound was then explored for any undue bleeding and there was no bleeding noted. The wound was then closed in layers using 2-0 Vicryl, followed by 4-0 Monocryl.  Benzoin and Steri-Strips were applied, followed by sterile dressing.  All instrument counts were correct at the termination of the procedure.   Of note, [name], PA-C, was my assistant throughout surgery, and did aid in retraction, suctioning, and closure from start to finish.       Phylliss Bob, MD

## 2019-06-07 NOTE — Progress Notes (Signed)
Orthopedic Tech Progress Note Patient Details:  Ricardo Heath [birth year] [id number] Collar was delivered to patients room Patient ID: Gypsy Decant, male   DOB: 1971/01/16, 49 y.o.   MRN: [id number]   Smitty Pluck 06/07/2019, 1:35 PM

## 2019-06-07 NOTE — Transfer of Care (Signed)
Immediate Anesthesia Transfer of Care Note  Patient: Ricardo Heath  Procedure(s) Performed: ANTERIOR CERVICAL DECOMPRESSION FUSION CERVICAL 4-5, CERVICAL 5-6, CERVICAL 6-7 WITH INSTRUMENTATION AND ALLOGRAFT (N/A )  Patient Location: PACU  Anesthesia Type:General  Level of Consciousness: awake and alert   Airway & Oxygen Therapy: Patient Spontanous Breathing and Patient connected to face mask oxygen  Post-op Assessment: Report given to RN, Post -op Vital signs reviewed and stable and Patient moving all extremities X 4  Post vital signs: Reviewed and stable  Last Vitals:  Vitals Value Taken Time  BP 152/87 06/07/19 1203  Temp 36.2 C 06/07/19 1203  Pulse 106 06/07/19 1209  Resp 21 06/07/19 1209  SpO2 99 % 06/07/19 1209  Vitals shown include unvalidated device data.  Last Pain:  Vitals:   06/07/19 1203  TempSrc:   PainSc: 6       Patients Stated Pain Goal: 3 (06/07/19 0654)  Complications: No apparent anesthesia complications

## 2019-06-07 NOTE — H&P (Signed)
PREOPERATIVE H&P  Chief Complaint: Balance deterioration  HPI: Ricardo Heath is a 49 y.o. male who presents with progressive deterioration in balance  MRI reveals spinal stenosis, including spinal cord compression C4-C7   Patient has failed multiple forms of conservative care and continues to have pain (see office notes for additional details regarding the patient's full course of treatment)  Past Medical History:  Diagnosis Date  . Complication of anesthesia    a little combative after back surgery  . DVT (deep venous thrombosis) ([place])   . GERD (gastroesophageal reflux disease)   . Gout   . Hypertension   . Peripheral vascular disease ([place])    blood clot left shoulder 2015  . Pneumonia   . Sleep apnea    uses cpap   Past Surgical History:  Procedure Laterality Date  . LUMBAR LAMINECTOMY/DECOMPRESSION MICRODISCECTOMY N/A 09/01/2017   Procedure: Lumbar four-five Laminectomy/Microdiscectomy;  Surgeon: Consuella Lose, MD;  Location: [place];  Service: Neurosurgery;  Laterality: N/A;   Social History   Socioeconomic History  . Marital status: Married    Spouse name: Not on file  . Number of children: Not on file  . Years of education: Not on file  . Highest education level: Not on file  Occupational History  . Not on file  Tobacco Use  . Smoking status: Current Some Day Smoker    Types: Cigarettes  . Smokeless tobacco: Never Used  Substance and Sexual Activity  . Alcohol use: Yes    Alcohol/week: 0.0 standard drinks    Comment: occ  . Drug use: No  . Sexual activity: Not on file  Other Topics Concern  . Not on file  Social History Narrative  . Not on file   Social Determinants of Health   Financial Resource Strain:   . Difficulty of Paying Living Expenses:   Food Insecurity:   . Worried About Charity fundraiser in the Last Year:   . Arboriculturist in the Last Year:   Transportation Needs:   . Film/video editor (Medical):   Marland Kitchen Lack of  Transportation (Non-Medical):   Physical Activity:   . Days of Exercise per Week:   . Minutes of Exercise per Session:   Stress:   . Feeling of Stress :   Social Connections:   . Frequency of Communication with Friends and Family:   . Frequency of Social Gatherings with Friends and Family:   . Attends Religious Services:   . Active Member of Clubs or Organizations:   . Attends Archivist Meetings:   Marland Kitchen Marital Status:    History reviewed. No pertinent family history. Allergies  Allergen Reactions  . Promethazine Palpitations   Prior to Admission medications   Medication Sig Start Date End Date Taking? Authorizing Provider  atorvastatin (LIPITOR) 10 MG tablet Take 10 mg by mouth daily.   Yes [provider]  benazepril (LOTENSIN) 40 MG tablet Take 40 mg by mouth daily.   Yes [provider]  chlorthalidone (HYGROTON) 25 MG tablet Take 12.5 mg by mouth daily.   Yes [provider]  diltiazem (CARDIZEM CD) 240 MG 24 hr capsule Take 240 mg by mouth daily.   Yes [provider]  doxycycline (MONODOX) 100 MG capsule Take 100 mg by mouth 2 (two) times daily.   Yes [provider]  traMADol (ULTRAM) 50 MG tablet Take 50 mg by mouth every 6 (six) hours as needed for pain. 05/16/19  Yes  [provider]  triamcinolone cream (KENALOG) 0.1 % Apply 1 application topically 2 (two) times daily.   Yes [provider]  gabapentin (NEURONTIN) 300 MG capsule Take 1 capsule (300 mg total) by mouth 3 (three) times daily. Patient not taking: Reported on 05/31/2019 09/01/17   Alyson Ingles, PA-C  methocarbamol (ROBAXIN) 500 MG tablet Take 1 tablet (500 mg total) by mouth every 6 (six) hours as needed for muscle spasms. Patient not taking: Reported on 05/31/2019 09/01/17   Alyson Ingles, PA-C  oxyCODONE-acetaminophen (PERCOCET) 7.5-325 MG tablet Take 1 tablet by mouth every 4 (four) hours as needed. Patient not taking: Reported on  05/31/2019 09/01/17   Alyson Ingles, PA-C     All other systems have been reviewed and were otherwise negative with the exception of those mentioned in the HPI and as above.  Physical Exam: Vitals:   06/07/19 0647  BP: 124/71  Pulse: 90  Resp: 18  Temp: 98.5 F (36.9 C)  SpO2: 99%    Body mass index is 43.38 kg/m.  General: Alert, no acute distress Cardiovascular: No pedal edema Respiratory: No cyanosis, no use of accessory musculature Skin: No lesions in the area of chief complaint Neurologic: Sensation intact distally Psychiatric: Patient is competent for consent with normal mood and affect Lymphatic: No axillary or cervical lymphadenopathy   Assessment/Plan: MYELOPATHY AND SPINAL CORD COMPRESSION Plan for Procedure(s): ANTERIOR CERVICAL DECOMPRESSION FUSION CERVICAL 4-5, CERVICAL 5-6, CERVICAL 6-7 WITH INSTRUMENTATION AND ALLOGRAFT   Jackelyn Hoehn, MD 06/07/2019 7:36 AM

## 2019-06-07 NOTE — Anesthesia Preprocedure Evaluation (Signed)
Anesthesia Evaluation  Patient identified by MRN, date of birth, ID band Patient awake    Reviewed: Allergy & Precautions, NPO status , Patient's Chart, lab work & pertinent test results  Airway Mallampati: II  TM Distance: >3 FB Neck ROM: Limited    Dental no notable dental hx.    Pulmonary sleep apnea , Current Smoker,    Pulmonary exam normal breath sounds clear to auscultation       Cardiovascular hypertension, + Peripheral Vascular Disease  Normal cardiovascular exam Rhythm:Regular Rate:Normal     Neuro/Psych negative neurological ROS  negative psych ROS   GI/Hepatic negative GI ROS, Neg liver ROS,   Endo/Other  Morbid obesity  Renal/GU negative Renal ROS  negative genitourinary   Musculoskeletal negative musculoskeletal ROS (+)   Abdominal (+) + obese,   Peds negative pediatric ROS (+)  Hematology negative hematology ROS (+)   Anesthesia Other Findings   Reproductive/Obstetrics negative OB ROS                             Anesthesia Physical Anesthesia Plan  ASA: III  Anesthesia Plan: General   Post-op Pain Management:    Induction: Intravenous  PONV Risk Score and Plan: 2 and Ondansetron, Dexamethasone and Treatment may vary due to age or medical condition  Airway Management Planned: Oral ETT  Additional Equipment:   Intra-op Plan:   Post-operative Plan: Extubation in OR  Informed Consent: I have reviewed the patients History and Physical, chart, labs and discussed the procedure including the risks, benefits and alternatives for the proposed anesthesia with the patient or authorized representative who has indicated his/her understanding and acceptance.     Dental advisory given  Plan Discussed with: CRNA and Surgeon  Anesthesia Plan Comments:         Anesthesia Quick Evaluation

## 2019-06-08 DIAGNOSIS — M4802 Spinal stenosis, cervical region: Secondary | ICD-10-CM | POA: Diagnosis not present

## 2019-06-08 MED ORDER — METHOCARBAMOL 500 MG PO TABS: 500.0000 mg | ORAL_TABLET | Freq: Four times a day (QID) | ORAL | 2 refills | Status: DC | PRN

## 2019-06-08 MED ORDER — OXYCODONE-ACETAMINOPHEN 5-325 MG PO TABS: 1.0000 | ORAL_TABLET | ORAL | 0 refills | Status: DC | PRN

## 2019-06-08 MED FILL — METHOCARBAMOL 500 MG TABS: 500 | 7 days supply | Qty: 30 | Fill #0

## 2019-06-08 MED FILL — OXYCODONE-ACETAMINOPHEN 5-3: 5-325 | 4 days supply | Qty: 30 | Fill #0

## 2019-06-08 NOTE — Plan of Care (Signed)
Pt doing well. Pt and wife given D/C instructions with verbal understanding. Rx's were sent to pharmacy by MD. Pt's incision is clean and dry with no sign of infection. Pt's IV was removed prior to D/C. Pt D/C'd home via wheelchair per MD order. Pt is stable @ D/C and has no other needs at this time. Jaque Dacy, RN  

## 2019-06-08 NOTE — Progress Notes (Signed)
    Patient doing well  Denies arm pain and reports improvement in hand tingling and numbness Tolerating PO well   Physical Exam: Vitals:   06/07/19 2347 06/08/19 0418  BP: 136/87 (!) 156/91  Pulse: (!) 105 (!) 110  Resp: 20 18  Temp: 98.6 F (37 C) 98.3 F (36.8 C)  SpO2: 95% 95%    Neck soft/supple Dressing in place NVI  POD #1 s/p ACDF, doing well  - encourage ambulation - Percocet for pain, Robaxin for muscle spasms - d/c home today with f/u in 2 weeks

## 2019-06-09 ENCOUNTER — Encounter: Payer: Self-pay | Admitting: *Deleted

## 2019-06-13 ENCOUNTER — Other Ambulatory Visit (HOSPITAL_COMMUNITY): Payer: Self-pay

## 2019-06-13 ENCOUNTER — Inpatient Hospital Stay (HOSPITAL_COMMUNITY): Admission: RE | Admit: 2019-06-13 | Payer: Self-pay | Source: Ambulatory Visit

## 2019-06-14 ENCOUNTER — Other Ambulatory Visit (HOSPITAL_COMMUNITY): Payer: Self-pay | Admitting: Orthopedic Surgery

## 2019-06-14 ENCOUNTER — Ambulatory Visit (HOSPITAL_COMMUNITY)
Admission: RE | Admit: 2019-06-14 | Discharge: 2019-06-14 | Disposition: A | Discharge status: Home / Self Care | Payer: 59 | Source: Ambulatory Visit | Attending: Orthopedic Surgery | Admitting: Orthopedic Surgery

## 2019-06-14 ENCOUNTER — Other Ambulatory Visit: Payer: Self-pay

## 2019-06-14 DIAGNOSIS — M79604 Pain in right leg: Secondary | ICD-10-CM

## 2019-06-14 DIAGNOSIS — M7989 Other specified soft tissue disorders: Secondary | ICD-10-CM | POA: Insufficient documentation

## 2019-06-14 NOTE — Progress Notes (Signed)
Lower extremity venous has been completed.   Preliminary results in CV Proc.   Blanch Media 06/14/2019 1:57 PM

## 2019-06-21 NOTE — Discharge Summary (Signed)
Patient ID: Ricardo Heath MRN: [id number] DOB/AGE: [birth year] 50 y.o.  Admit date: 06/07/2019 Discharge date: 06/08/2019  Admission Diagnoses:  Active Problems:   Radiculopathy   Discharge Diagnoses:  Same  Past Medical History:  Diagnosis Date  . Complication of anesthesia    a little combative after back surgery  . DVT (deep venous thrombosis) ([place])   . GERD (gastroesophageal reflux disease)   . Gout   . Hypertension   . Peripheral vascular disease ([place])    blood clot left shoulder 2015  . Pneumonia   . Sleep apnea    uses cpap    Surgeries: Procedure(s): ANTERIOR CERVICAL DECOMPRESSION FUSION CERVICAL 4-5, CERVICAL 5-6, CERVICAL 6-7 WITH INSTRUMENTATION AND ALLOGRAFT on 06/07/2019   Consultants: None  Discharged Condition: Improved  Hospital Course: Ricardo Heath is an 49 y.o. male who was admitted 06/07/2019 for operative treatment of myelopathy. Patient has severe unremitting pain that affects sleep, daily activities, and work/hobbies. After pre-op clearance the patient was taken to the operating room on 06/07/2019 and underwent  Procedure(s): ANTERIOR CERVICAL DECOMPRESSION FUSION CERVICAL 4-5, CERVICAL 5-6, CERVICAL 6-7 WITH INSTRUMENTATION AND ALLOGRAFT.    Patient was given perioperative antibiotics:  Anti-infectives (From admission, onward)   Start     Dose/Rate Route Frequency Ordered Stop   06/07/19 1700  ceFAZolin (ANCEF) IVPB 2g/100 mL premix     2 g 200 mL/hr over 30 Minutes Intravenous Every 8 hours 06/07/19 1327 06/08/19 0030   06/07/19 1330  doxycycline (VIBRA-TABS) tablet 100 mg  Status:  Discontinued     100 mg Oral 2 times daily 06/07/19 1326 06/08/19 1455   06/07/19 0600  ceFAZolin (ANCEF) 3 g in dextrose 5 % 50 mL IVPB     3 g 100 mL/hr over 30 Minutes Intravenous To ShortStay Surgical 06/06/19 0808 06/07/19 0845       Patient was given sequential compression devices, early ambulation to prevent DVT.  Patient benefited maximally from  hospital stay and there were no complications.    Recent vital signs: BP (!) 141/69 (BP Location: Right Arm)   Pulse 95   Temp 98.6 F (37 C) (Oral)   Resp 18   Ht 5\' 7"  (1.702 m)   Wt 125.6 kg   SpO2 97%   BMI 43.38 kg/m    Discharge Medications:   Allergies as of 06/08/2019      Reactions   Promethazine Palpitations      Medication List    TAKE these medications   atorvastatin 10 MG tablet Commonly known as: LIPITOR Take 10 mg by mouth daily.   benazepril 40 MG tablet Commonly known as: LOTENSIN Take 40 mg by mouth daily.   chlorthalidone 25 MG tablet Commonly known as: HYGROTON Take 12.5 mg by mouth daily.   diltiazem 240 MG 24 hr capsule Commonly known as: CARDIZEM CD Take 240 mg by mouth daily.   doxycycline 100 MG capsule Commonly known as: MONODOX Take 100 mg by mouth 2 (two) times daily.   methocarbamol 500 MG tablet Commonly known as: ROBAXIN Take 1 tablet (500 mg total) by mouth every 6 (six) hours as needed for muscle spasms.   oxyCODONE-acetaminophen 5-325 MG tablet Commonly known as: PERCOCET/ROXICET Take 1-2 tablets by mouth every 4 (four) hours as needed for moderate pain or severe pain.   traMADol 50 MG tablet Commonly known as: ULTRAM Take 50 mg by mouth every 6 (six) hours as needed for pain.   triamcinolone cream 0.1 % Commonly known as:  KENALOG Apply 1 application topically 2 (two) times daily.       Diagnostic Studies: DG Cervical Spine 2 or 3 views  Result Date: 06/07/2019 CLINICAL DATA:  Surgical anterior fusion of C4-5, C5-6 and C6-7. EXAM: CERVICAL SPINE - 2-3 VIEW; DG C-ARM 1-60 MIN COMPARISON:  None. FLUOROSCOPY TIME:  20 seconds. FINDINGS: Two intraoperative fluoroscopic images were obtained of the cervical spine. These demonstrate the patient be status post surgical anterior fusion of C4-5, C5-6 and C6-7. Good alignment of vertebral bodies is noted. IMPRESSION: Status post surgical anterior fusion of C4-5, C5-6 and C6-7.  Electronically Signed   By: Lupita Raider M.D.   On: 06/07/2019 12:07   DG C-Arm 1-60 Min  Result Date: 06/07/2019 CLINICAL DATA:  Surgical anterior fusion of C4-5, C5-6 and C6-7. EXAM: CERVICAL SPINE - 2-3 VIEW; DG C-ARM 1-60 MIN COMPARISON:  None. FLUOROSCOPY TIME:  20 seconds. FINDINGS: Two intraoperative fluoroscopic images were obtained of the cervical spine. These demonstrate the patient be status post surgical anterior fusion of C4-5, C5-6 and C6-7. Good alignment of vertebral bodies is noted. IMPRESSION: Status post surgical anterior fusion of C4-5, C5-6 and C6-7. Electronically Signed   By: Lupita Raider M.D.   On: 06/07/2019 12:07   VAS [place] LOWER EXTREMITY VENOUS (DVT)  Result Date: 06/14/2019  Lower Venous DVTStudy Indications: Swelling, and Pain.  Comparison Study: no prior Performing Technologist: Blanch Media RVS  Examination Guidelines: A complete evaluation includes B-mode imaging, spectral Doppler, color Doppler, and power Doppler as needed of all accessible portions of each vessel. Bilateral testing is considered an integral part of a complete examination. Limited examinations for reoccurring indications may be performed as noted. The reflux portion of the exam is performed with the patient in reverse Trendelenburg.  +---------+---------------+---------+-----------+----------+--------------+ RIGHT    CompressibilityPhasicitySpontaneityPropertiesThrombus Aging +---------+---------------+---------+-----------+----------+--------------+ CFV      Full           Yes      Yes                                 +---------+---------------+---------+-----------+----------+--------------+ SFJ      Full                                                        +---------+---------------+---------+-----------+----------+--------------+ FV Prox  Full                                                        +---------+---------------+---------+-----------+----------+--------------+  FV Mid   Full                                                        +---------+---------------+---------+-----------+----------+--------------+ FV DistalFull                                                        +---------+---------------+---------+-----------+----------+--------------+  PFV      Full                                                        +---------+---------------+---------+-----------+----------+--------------+ POP      Full           Yes      Yes                                 +---------+---------------+---------+-----------+----------+--------------+ PTV      Full                                                        +---------+---------------+---------+-----------+----------+--------------+ PERO                                                  Not visualized +---------+---------------+---------+-----------+----------+--------------+   +----+---------------+---------+-----------+----------+--------------+ LEFTCompressibilityPhasicitySpontaneityPropertiesThrombus Aging +----+---------------+---------+-----------+----------+--------------+ CFV Full           Yes      Yes                                 +----+---------------+---------+-----------+----------+--------------+     Summary: RIGHT: - There is no evidence of deep vein thrombosis in the lower extremity.  - No cystic structure found in the popliteal fossa.  LEFT: - No evidence of common femoral vein obstruction.  *See table(s) above for measurements and observations. Electronically signed by Gretta Began MD on 06/14/2019 at 3:32:57 PM.    Final     Disposition: Discharge disposition: 01-Home or Self Care        POD #1 s/p ACDF, doing well -Scripts for pain sent to pharmacy electronically  -D/C instructions sheet printed and in chart -D/C today  -F/U in office 2 weeks   Signed: Eilene Ghazi Temesha Queener 06/21/2019, 10:12 AM

## 2020-01-25 ENCOUNTER — Ambulatory Visit: Payer: Self-pay | Admitting: Allergy and Immunology

## 2020-01-29 ENCOUNTER — Ambulatory Visit: Payer: Self-pay | Admitting: Allergy and Immunology

## 2020-03-31 ENCOUNTER — Encounter (HOSPITAL_BASED_OUTPATIENT_CLINIC_OR_DEPARTMENT_OTHER): Payer: Self-pay | Admitting: Emergency Medicine

## 2020-03-31 ENCOUNTER — Other Ambulatory Visit: Payer: Self-pay

## 2020-03-31 ENCOUNTER — Emergency Department (HOSPITAL_BASED_OUTPATIENT_CLINIC_OR_DEPARTMENT_OTHER)
Admission: EM | Admit: 2020-03-31 | Discharge: 2020-03-31 | Disposition: A | Discharge status: Home / Self Care | Payer: 59 | Attending: Emergency Medicine | Admitting: Emergency Medicine

## 2020-03-31 DIAGNOSIS — Z79899 Other long term (current) drug therapy: Secondary | ICD-10-CM | POA: Diagnosis not present

## 2020-03-31 DIAGNOSIS — F17210 Nicotine dependence, cigarettes, uncomplicated: Secondary | ICD-10-CM | POA: Diagnosis not present

## 2020-03-31 DIAGNOSIS — M62830 Muscle spasm of back: Secondary | ICD-10-CM

## 2020-03-31 DIAGNOSIS — I10 Essential (primary) hypertension: Secondary | ICD-10-CM | POA: Insufficient documentation

## 2020-03-31 DIAGNOSIS — M545 Low back pain, unspecified: Secondary | ICD-10-CM | POA: Insufficient documentation

## 2020-03-31 MED ORDER — LIDOCAINE 5 % EX PTCH
1.0000 | MEDICATED_PATCH | CUTANEOUS | Status: DC
  Administered 2020-03-31: 1 via TRANSDERMAL
  Filled 2020-03-31: qty 1

## 2020-03-31 MED ORDER — OXYCODONE-ACETAMINOPHEN 5-325 MG PO TABS: 1.0000 | ORAL_TABLET | ORAL | 0 refills | Status: DC | PRN

## 2020-03-31 MED ORDER — OXYCODONE-ACETAMINOPHEN 5-325 MG PO TABS
1.0000 | ORAL_TABLET | Freq: Once | ORAL | Status: AC
  Administered 2020-03-31: 1 via ORAL
  Filled 2020-03-31: qty 1

## 2020-03-31 MED ORDER — CYCLOBENZAPRINE HCL 10 MG PO TABS: 10.0000 mg | ORAL_TABLET | Freq: Two times a day (BID) | ORAL | 0 refills | Status: DC | PRN

## 2020-03-31 MED ORDER — LIDOCAINE 5 % EX PTCH: 1.0000 | MEDICATED_PATCH | CUTANEOUS | 0 refills | Status: DC

## 2020-03-31 MED ORDER — CYCLOBENZAPRINE HCL 10 MG PO TABS
10.0000 mg | ORAL_TABLET | Freq: Once | ORAL | Status: AC
  Administered 2020-03-31: 10 mg via ORAL
  Filled 2020-03-31: qty 1

## 2020-03-31 NOTE — ED Triage Notes (Signed)
Pt arrives pov with c/o lower back pain x 3 weeks, denies injury. Meloxincam x 2 hrs pta with no relief

## 2020-03-31 NOTE — ED Notes (Signed)
Has Hx of back surgery x 1, Herniated Disc, 2019 . Denies any trauma, fall or straining of back. Allergy Status is confirmed with pt

## 2020-03-31 NOTE — Discharge Instructions (Signed)
Your history and exam today are consistent with severe muscle spasms causing your back pain.  With your lack of acute injury, we agreed to hold on imaging today however if your symptoms persist, you may need further imaging.  You did not have any focal neurologic deficits or any red flags for cord injury as we discussed.  Please stop taking the meloxicam and try the Flexeril and use the pain medicine and Lidoderm patches to help with the discomfort.  I would like you to follow-up with sports medicine or your back doctor to help with further pain management as I suspect your new job has caused this back exacerbation.  Please rest for the next few days.  Please stay hydrated.  If any symptoms change or acutely worsen, please return to the nearest emergency department.

## 2020-03-31 NOTE — ED Notes (Signed)
AVS reviewed with pt, also informed pt that a work note was provided by the EDP. Pt informed of the 3 Rxs sent electronically to the pharmacy listed on the medical chart. Discussed each drug and discussed safety while taking PO muscle relaxants and PO opioids. Also discussed how to and when to change lido patch. Discussed proper body mechanics and use of proper body position alignment when lying in bed to aid in decreasing pain. Opportunity for questions provided. Pt able to ambulate independently to ck out desk.

## 2020-03-31 NOTE — ED Notes (Signed)
Presents with a 3 week hx of progressively worsening back pain, lower mid to rt lower back area. Feels like spasms per pt, when attempting to plantar and dorsal flex back pain is worse. Strong Rt plantar flexion, Weak Lt plantar flexion is noted.

## 2020-03-31 NOTE — ED Provider Notes (Signed)
[facility] EMERGENCY DEPARTMENT Provider Note   CSN: [id number] Arrival date & time: 03/31/20  1752     History Chief Complaint  Patient presents with  . Back Pain    Ricardo Heath is a 50 y.o. male.  The history is provided by the patient, the spouse and medical records. No language interpreter was used.  Back Pain Location:  Lumbar spine Quality:  Stabbing Radiates to:  Does not radiate Pain severity:  Severe Onset quality:  Gradual Duration:  2 weeks Timing:  Constant Progression:  Unchanged Chronicity:  Recurrent Context comment:  New job moving things Relieved by:  Nothing Worsened by:  Palpation, sitting, touching, twisting and movement Ineffective treatments:  Cold packs, heating pad, lying down, muscle relaxants, NSAIDs, OTC medications and being still Associated symptoms: no abdominal pain, no abdominal swelling, no bladder incontinence, no bowel incontinence, no chest pain, no fever, no headaches, no leg pain, no numbness, no paresthesias, no pelvic pain, no perianal numbness, no tingling and no weakness        Past Medical History:  Diagnosis Date  . Complication of anesthesia    a little combative after back surgery  . DVT (deep venous thrombosis) (HCC)   . GERD (gastroesophageal reflux disease)   . Gout   . Hypertension   . Peripheral vascular disease (HCC)    blood clot left shoulder 2015  . Pneumonia   . Sleep apnea    uses cpap    Patient Active Problem List   Diagnosis Date Noted  . Radiculopathy 06/07/2019  . Lumbar herniated disc 08/29/2017  . Right shoulder pain 06/28/2015  . Forearm contusion 06/18/2015    Past Surgical History:  Procedure Laterality Date  . ANTERIOR CERVICAL DECOMPRESSION/DISCECTOMY FUSION 4 LEVELS N/A 06/07/2019   Procedure: ANTERIOR CERVICAL DECOMPRESSION FUSION CERVICAL 4-5, CERVICAL 5-6, CERVICAL 6-7 WITH INSTRUMENTATION AND ALLOGRAFT;  Surgeon: Estill Bamberg, MD;  Location: [facility] OR;  Service:  Orthopedics;  Laterality: N/A;  . LUMBAR LAMINECTOMY/DECOMPRESSION MICRODISCECTOMY N/A 09/01/2017   Procedure: Lumbar four-five Laminectomy/Microdiscectomy;  Surgeon: Lisbeth Renshaw, MD;  Location: [facility] OR;  Service: Neurosurgery;  Laterality: N/A;       History reviewed. No pertinent family history.  Social History   Tobacco Use  . Smoking status: Current Some Day Smoker    Types: Cigarettes  . Smokeless tobacco: Never Used  Vaping Use  . Vaping Use: Every day  . Devices: smokes hemp for pain   Substance Use Topics  . Alcohol use: Yes    Alcohol/week: 0.0 standard drinks    Comment: occ  . Drug use: No    Home Medications Prior to Admission medications   Medication Sig Start Date End Date Taking? Authorizing Provider  atorvastatin (LIPITOR) 10 MG tablet Take 10 mg by mouth daily.    [provider]  benazepril (LOTENSIN) 40 MG tablet Take 40 mg by mouth daily.    [provider]  chlorthalidone (HYGROTON) 25 MG tablet Take 12.5 mg by mouth daily.    [provider]  diltiazem (CARDIZEM CD) 240 MG 24 hr capsule Take 240 mg by mouth daily.    [provider]  doxycycline (MONODOX) 100 MG capsule Take 100 mg by mouth 2 (two) times daily.    [provider]  methocarbamol (ROBAXIN) 500 MG tablet Take 1 tablet (500 mg total) by mouth every 6 (six) hours as needed for muscle spasms. 06/08/19   Estill Bamberg, MD  oxyCODONE-acetaminophen (PERCOCET/ROXICET) 5-325 MG tablet Take 1-2 tablets  by mouth every 4 (four) hours as needed for moderate pain or severe pain. 06/08/19   Estill Bamberg, MD  traMADol (ULTRAM) 50 MG tablet Take 50 mg by mouth every 6 (six) hours as needed for pain. 05/16/19   [provider]  triamcinolone cream (KENALOG) 0.1 % Apply 1 application topically 2 (two) times daily.    [provider]    Allergies    Promethazine  Review of Systems   Review of Systems  Constitutional: Negative for chills,  diaphoresis, fatigue and fever.  HENT: Negative for congestion.   Cardiovascular: Negative for chest pain.  Gastrointestinal: Negative for abdominal pain, bowel incontinence, constipation, diarrhea, nausea and vomiting.  Genitourinary: Negative for bladder incontinence, flank pain and pelvic pain.  Musculoskeletal: Positive for back pain. Negative for neck pain and neck stiffness.  Skin: Negative for wound.  Neurological: Negative for dizziness, tingling, tremors, speech difficulty, weakness, light-headedness, numbness, headaches and paresthesias.  Psychiatric/Behavioral: Negative for agitation.  All other systems reviewed and are negative.   Physical Exam Updated Vital Signs BP 125/70 (BP Location: Right Arm)   Pulse 86   Temp 98.9 F (37.2 C) (Oral)   Resp (!) 24   Ht 5\' 8"  (1.727 m)   Wt 127 kg   SpO2 98%   BMI 42.57 kg/m   Physical Exam Vitals and nursing note reviewed.  Constitutional:      General: He is not in acute distress.    Appearance: He is well-developed and well-nourished. He is not ill-appearing, toxic-appearing or diaphoretic.  HENT:     Head: Normocephalic and atraumatic.     Nose: No congestion or rhinorrhea.     Mouth/Throat:     Mouth: Mucous membranes are moist.  Eyes:     Conjunctiva/sclera: Conjunctivae normal.  Cardiovascular:     Rate and Rhythm: Normal rate and regular rhythm.     Heart sounds: No murmur heard.   Pulmonary:     Effort: Pulmonary effort is normal. No respiratory distress.     Breath sounds: Normal breath sounds. No wheezing, rhonchi or rales.  Chest:     Chest wall: No tenderness.  Abdominal:     General: Abdomen is flat.     Palpations: Abdomen is soft.     Tenderness: There is no abdominal tenderness. There is no right CVA tenderness, left CVA tenderness, guarding or rebound.  Musculoskeletal:        General: Tenderness present. No edema.     Cervical back: Neck supple. No tenderness.     Thoracic back: No tenderness.      Lumbar back: Spasms and tenderness present. No signs of trauma. Negative right straight leg raise test and negative left straight leg raise test.       Back:     Right lower leg: No edema.     Left lower leg: No edema.     Comments: Intact strength and sensation in legs.  Tenderness in the low back with significant spasms.  More tenderness and paraspinally than midline.  No rashes seen.  No CVA tenderness more superiorly.  Exam otherwise unremarkable.  Skin:    General: Skin is warm and dry.     Capillary Refill: Capillary refill takes less than 2 seconds.     Coloration: Skin is not pale.     Findings: No erythema.  Neurological:     General: No focal deficit present.     Mental Status: He is alert.     Sensory: No  sensory deficit.     Motor: No weakness.     Coordination: Coordination normal.  Psychiatric:        Mood and Affect: Mood and affect and mood normal.     ED Results / Procedures / Treatments   Labs (all labs ordered are listed, but only abnormal results are displayed) Labs Reviewed - No data to display  EKG None  Radiology No results found.  Procedures Procedures   Medications Ordered in ED Medications  lidocaine (LIDODERM) 5 % 1 patch (1 patch Transdermal Patch Applied 03/31/20 1955)  oxyCODONE-acetaminophen (PERCOCET/ROXICET) 5-325 MG per tablet 1 tablet (1 tablet Oral Given 03/31/20 1955)  cyclobenzaprine (FLEXERIL) tablet 10 mg (10 mg Oral Given 03/31/20 1955)    ED Course  I have reviewed the triage vital signs and the nursing notes.  Pertinent labs & imaging results that were available during my care of the patient were reviewed by me and considered in my medical decision making (see chart for details).    MDM Rules/Calculators/A&P                          Kiah Keay is a 50 y.o. male with a past medical history significant for GERD, vascular disease, hypertension, prior DVT, and prior lumbar surgery who presents with severe back pain.  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  He says that he did not have a specific fall or injury but his back is now spasming constantly and he can barely sit.  He reports that he has been trying meloxicam that he has had in the past, over-the-counter medicines, heat pads, cooling pads, massage, creams, and has not had any relief.  He is not having any loss of bowel or bladder control but he does report the pain hurts so much she has difficult time getting to the restroom in time.  There is no true incontinence.  He denies any numbness or weakness in the legs.  He denies any upper extremity symptoms.  He reports the pain is greater than 10 out of 10 at times and he is appears very uncomfortable sitting in the exam room.  Denies any groin pain or abdominal pain.  Denies other injuries or complaints.  On exam, lungs are clear and chest is nontender.  Abdomen is nontender.  Upper back is nontender.  He has normal strength and sensation in extremities.  Good pulses.  He has severe tenderness in his low back primarily in the bilateral paraspinal areas with visible muscle spasms.  No neurologic deficits.  Had a long shared decision made conversation with patient and wife who works in healthcare.  We offered imaging however given the lack of a new acute injury, we agreed to hold on x-ray or CT imaging at this time.  We will instead try to augment the pain regimen temporarily to get him under better pain control and have her follow-up with sports medicine and his back doctor to help with the increase in pain with his new job.  We will give him a dose of Percocet, Flexeril instead of the Robaxin, and use a Lidoderm patch here.  Will give prescriptions for these and he will follow up with the sports medicine team.  He understands actually strict return precautions and to watch for red flag  symptoms.  Patient  had no other questions or concerns and was discharged in good condition with acute severe back spasms and pain.    Final Clinical Impression(s) / ED Diagnoses Final diagnoses:  Acute bilateral low back pain without sciatica  Muscle spasm of back    Rx / DC Orders ED Discharge Orders         Ordered    oxyCODONE-acetaminophen (PERCOCET/ROXICET) 5-325 MG tablet  Every 4 hours PRN        03/31/20 1957    lidocaine (LIDODERM) 5 %  Every 24 hours        03/31/20 1957    cyclobenzaprine (FLEXERIL) 10 MG tablet  2 times daily PRN        03/31/20 1957          Clinical Impression: 1. Acute bilateral low back pain without sciatica   2. Muscle spasm of back     Disposition: Discharge  Condition: Good  I have discussed the results, Dx and Tx plan with the pt(& family if present). He/she/they expressed understanding and agree(s) with the plan. Discharge instructions discussed at great length. Strict return precautions discussed and pt &/or family have verbalized understanding of the instructions. No further questions at time of discharge.    New Prescriptions   CYCLOBENZAPRINE (FLEXERIL) 10 MG TABLET    Take 1 tablet (10 mg total) by mouth 2 (two) times daily as needed.   LIDOCAINE (LIDODERM) 5 %    Place 1 patch onto the skin daily. Remove & Discard patch within 12 hours or as directed by MD   OXYCODONE-ACETAMINOPHEN (PERCOCET/ROXICET) 5-325 MG TABLET    Take 1 tablet by mouth every 4 (four) hours as needed.    Follow Up: Myra Rude, MD [address] [place] [address] [phone]     Rosemary Holms [address] [place] [address] [phone]     [facility] [address] [id number] IP JASN [place] [address] [phone]       Correy Weidner, Canary Brim, MD 03/31/20 2011

## 2020-04-17 ENCOUNTER — Telehealth: Payer: Self-pay | Admitting: Family Medicine

## 2020-04-17 NOTE — Telephone Encounter (Signed)
Called pt to offer ED f/u for sciatica pain--per pt his health ins doesn't kick in till Mid-March--pt to call back to schedule-glh

## 2020-11-13 ENCOUNTER — Other Ambulatory Visit: Payer: Self-pay

## 2020-11-13 ENCOUNTER — Emergency Department (HOSPITAL_BASED_OUTPATIENT_CLINIC_OR_DEPARTMENT_OTHER)
Admission: EM | Admit: 2020-11-13 | Discharge: 2020-11-13 | Disposition: A | Discharge status: Home / Self Care | Payer: BC Managed Care – PPO | Attending: Emergency Medicine | Admitting: Emergency Medicine

## 2020-11-13 ENCOUNTER — Encounter (HOSPITAL_BASED_OUTPATIENT_CLINIC_OR_DEPARTMENT_OTHER): Payer: Self-pay

## 2020-11-13 DIAGNOSIS — R109 Unspecified abdominal pain: Secondary | ICD-10-CM | POA: Insufficient documentation

## 2020-11-13 DIAGNOSIS — R9431 Abnormal electrocardiogram [ECG] [EKG]: Secondary | ICD-10-CM

## 2020-11-13 DIAGNOSIS — Z79899 Other long term (current) drug therapy: Secondary | ICD-10-CM | POA: Diagnosis not present

## 2020-11-13 DIAGNOSIS — I10 Essential (primary) hypertension: Secondary | ICD-10-CM | POA: Diagnosis not present

## 2020-11-13 DIAGNOSIS — R221 Localized swelling, mass and lump, neck: Secondary | ICD-10-CM | POA: Diagnosis present

## 2020-11-13 DIAGNOSIS — R110 Nausea: Secondary | ICD-10-CM | POA: Insufficient documentation

## 2020-11-13 DIAGNOSIS — T7840XA Allergy, unspecified, initial encounter: Secondary | ICD-10-CM | POA: Diagnosis not present

## 2020-11-13 DIAGNOSIS — Z87891 Personal history of nicotine dependence: Secondary | ICD-10-CM | POA: Diagnosis not present

## 2020-11-13 MED ORDER — EPINEPHRINE 0.3 MG/0.3ML IJ SOAJ: 0.3000 mg | INTRAMUSCULAR | 0 refills | Status: AC | PRN

## 2020-11-13 MED ORDER — METHYLPREDNISOLONE SODIUM SUCC 125 MG IJ SOLR
125.0000 mg | Freq: Once | INTRAMUSCULAR | Status: AC
Start: 2020-11-13 — End: 2020-11-13
  Administered 2020-11-13: 125 mg via INTRAVENOUS
  Filled 2020-11-13: qty 2

## 2020-11-13 MED ORDER — DIPHENHYDRAMINE HCL 50 MG/ML IJ SOLN
25.0000 mg | Freq: Once | INTRAMUSCULAR | Status: AC
Start: 2020-11-13 — End: 2020-11-13
  Administered 2020-11-13: 25 mg via INTRAVENOUS
  Filled 2020-11-13: qty 1

## 2020-11-13 MED ORDER — FAMOTIDINE IN NACL 20-0.9 MG/50ML-% IV SOLN
20.0000 mg | Freq: Once | INTRAVENOUS | Status: AC
Start: 2020-11-13 — End: 2020-11-13
  Administered 2020-11-13: 20 mg via INTRAVENOUS
  Filled 2020-11-13: qty 50

## 2020-11-13 MED ORDER — FAMOTIDINE 20 MG PO TABS: 20.0000 mg | ORAL_TABLET | Freq: Two times a day (BID) | ORAL | 0 refills | Status: AC

## 2020-11-13 MED ORDER — DIPHENHYDRAMINE HCL 25 MG PO TABS: 25.0000 mg | ORAL_TABLET | Freq: Four times a day (QID) | ORAL | 0 refills | Status: AC | PRN

## 2020-11-13 NOTE — ED Triage Notes (Signed)
Pt states he ate an egg roll ~330pm-has shellfish allergy-c/o SOB, throat sore-hard to swallow-nurse at work gave epi ~415pm-NAD-to triage in w/c

## 2020-11-13 NOTE — Discharge Instructions (Addendum)
Please pick up medication and take as prescribed over the next few days to help with your allergic response. I have also prescribed an epi pen for you in case you need it in the future for an allergic reaction.   Follow up with your PCP regarding ED visit today and for further eval of your EKG changes seen today. This may be normal for you however it was not seen on previous EKGs.   Return to the ED IMMEDIATELY for any new/worsening symptoms

## 2020-11-13 NOTE — ED Provider Notes (Signed)
[facility] EMERGENCY DEPARTMENT Provider Note   CSN: [id number] Arrival date & time: 11/13/20  1709     History Chief Complaint  Patient presents with   Allergic Reaction    Ricardo Heath is a 50 y.o. male who presents to the ED Today for allergic reaction.  Patient reports he has an allergy to shellfish.  Around 3:30 PM he ate an egg roll.  He states about 10 to 15 minutes later he began having complaints of throat swelling, difficulty breathing, abdominal pain, nausea.  He states he was at work and the nurse at work gave him an epi pen injection. He states maybe slight improvement after epi pen however still continues to have same complaints. He did not take benadryl or any other medications prior to arrival here.   The history is provided by the patient, the spouse and medical records.      Past Medical History:  Diagnosis Date   Complication of anesthesia    a little combative after back surgery   DVT (deep venous thrombosis) (HCC)    GERD (gastroesophageal reflux disease)    Gout    Hypertension    Peripheral vascular disease (HCC)    blood clot left shoulder 2015   Pneumonia    Sleep apnea    uses cpap    Patient Active Problem List   Diagnosis Date Noted   Radiculopathy 06/07/2019   Lumbar herniated disc 08/29/2017   Right shoulder pain 06/28/2015   Forearm contusion 06/18/2015    Past Surgical History:  Procedure Laterality Date   ANTERIOR CERVICAL DECOMPRESSION/DISCECTOMY FUSION 4 LEVELS N/A 06/07/2019   Procedure: ANTERIOR CERVICAL DECOMPRESSION FUSION CERVICAL 4-5, CERVICAL 5-6, CERVICAL 6-7 WITH INSTRUMENTATION AND ALLOGRAFT;  Surgeon: Estill Bamberg, MD;  Location: [facility] OR;  Service: Orthopedics;  Laterality: N/A;   LUMBAR LAMINECTOMY/DECOMPRESSION MICRODISCECTOMY N/A 09/01/2017   Procedure: Lumbar four-five Laminectomy/Microdiscectomy;  Surgeon: Lisbeth Renshaw, MD;  Location: [facility] OR;  Service: Neurosurgery;  Laterality: N/A;       No family  history on file.  Social History   Tobacco Use   Smoking status: Former    Types: Cigarettes   Smokeless tobacco: Never  Vaping Use   Vaping Use: Every day   Devices: smokes hemp for pain   Substance Use Topics   Alcohol use: Yes    Alcohol/week: 0.0 standard drinks    Comment: occ   Drug use: No    Home Medications Prior to Admission medications   Medication Sig Start Date End Date Taking? Authorizing Provider  diphenhydrAMINE (BENADRYL) 25 MG tablet Take 1 tablet (25 mg total) by mouth every 6 (six) hours as needed. 11/13/20  Yes Kiyona Mcnall, PA-C  EPINEPHrine 0.3 mg/0.3 mL IJ SOAJ injection Inject 0.3 mg into the muscle as needed for anaphylaxis. 11/13/20  Yes Kalei Mckillop, PA-C  famotidine (PEPCID) 20 MG tablet Take 1 tablet (20 mg total) by mouth 2 (two) times daily. 11/13/20  Yes Keshayla Schrum, PA-C  atorvastatin (LIPITOR) 10 MG tablet Take 10 mg by mouth daily.    [provider]  benazepril (LOTENSIN) 40 MG tablet Take 40 mg by mouth daily.    [provider]  chlorthalidone (HYGROTON) 25 MG tablet Take 12.5 mg by mouth daily.    [provider]  cyclobenzaprine (FLEXERIL) 10 MG tablet Take 1 tablet (10 mg total) by mouth 2 (two) times daily as needed. 03/31/20   Tegeler, Canary Brim, MD  diltiazem (CARDIZEM CD) 240 MG 24 hr capsule  Take 240 mg by mouth daily.    [provider]  doxycycline (MONODOX) 100 MG capsule Take 100 mg by mouth 2 (two) times daily.    [provider]  lidocaine (LIDODERM) 5 % Place 1 patch onto the skin daily. Remove & Discard patch within 12 hours or as directed by MD 03/31/20   Tegeler, Canary Brim, MD  methocarbamol (ROBAXIN) 500 MG tablet Take 1 tablet (500 mg total) by mouth every 6 (six) hours as needed for muscle spasms. 06/08/19   Estill Bamberg, MD  oxyCODONE-acetaminophen (PERCOCET/ROXICET) 5-325 MG tablet Take 1-2 tablets by mouth every 4 (four) hours as needed for moderate pain or severe  pain. 06/08/19   Estill Bamberg, MD  oxyCODONE-acetaminophen (PERCOCET/ROXICET) 5-325 MG tablet Take 1 tablet by mouth every 4 (four) hours as needed. 03/31/20   Tegeler, Canary Brim, MD  traMADol (ULTRAM) 50 MG tablet Take 50 mg by mouth every 6 (six) hours as needed for pain. 05/16/19   [provider]  triamcinolone cream (KENALOG) 0.1 % Apply 1 application topically 2 (two) times daily.    [provider]    Allergies    Shellfish allergy and Promethazine  Review of Systems   Review of Systems  Constitutional:  Negative for chills and fever.  HENT:  Positive for facial swelling and trouble swallowing. Negative for voice change.   Respiratory:  Positive for shortness of breath.   Gastrointestinal:  Negative for vomiting.  Skin:  Negative for rash.  All other systems reviewed and are negative.  Physical Exam Updated Vital Signs BP 110/75 (BP Location: Right Arm)   Pulse 82   Temp 98.7 F (37.1 C) (Oral)   Resp 18   Ht 5\' 8"  (1.727 m)   Wt 115.7 kg   SpO2 100%   BMI 38.77 kg/m   Physical Exam Vitals and nursing note reviewed.  Constitutional:      Appearance: He is obese. He is not ill-appearing or diaphoretic.  HENT:     Head: Normocephalic and atraumatic.     Mouth/Throat:     Comments: Phonating normally. Tolerating secretions without difficulty. Airway patent. Uvula midline. No posterior oropharyngeal erythema or edema. Slight swelling noted to bottom lip.  Eyes:     Conjunctiva/sclera: Conjunctivae normal.  Cardiovascular:     Rate and Rhythm: Normal rate and regular rhythm.  Pulmonary:     Breath sounds: No wheezing, rhonchi or rales.     Comments: Tachypneic however speaking in full sentences. Satting 98% on RA. LCTAB.  Abdominal:     Palpations: Abdomen is soft.     Tenderness: There is no abdominal tenderness.  Musculoskeletal:     Cervical back: Neck supple.  Skin:    General: Skin is warm and dry.     Findings: No rash.  Neurological:      Mental Status: He is alert.    ED Results / Procedures / Treatments   Labs (all labs ordered are listed, but only abnormal results are displayed) Labs Reviewed - No data to display  EKG EKG Interpretation  Date/Time:  Wednesday November 13 2020 17:34:28 EDT Ventricular Rate:  83 PR Interval:  163 QRS Duration: 152 QT Interval:  412 QTC Calculation: 485 R Axis:   -15 Text Interpretation: Sinus rhythm new Right bundle branch block Confirmed by 07-05-1989 (Gwyneth Sprout) on 11/13/2020 5:40:11 PM  Radiology No results found.  Procedures Procedures   Medications Ordered in ED Medications  diphenhydrAMINE (BENADRYL) injection 25 mg (25 mg  Intravenous Given 11/13/20 1741)  methylPREDNISolone sodium succinate (SOLU-MEDROL) 125 mg/2 mL injection 125 mg (125 mg Intravenous Given 11/13/20 1741)  famotidine (PEPCID) IVPB 20 mg premix (0 mg Intravenous Stopped 11/13/20 1811)    ED Course  I have reviewed the triage vital signs and the nursing notes.  Pertinent labs & imaging results that were available during my care of the patient were reviewed by me and considered in my medical decision making (see chart for details).    MDM Rules/Calculators/A&P                           50 year old male who presents to the ED today for allergic reaction.  Received epi at work around 4:15 PM today.  He arrives around 5:15 PM with continued symptoms however there is slight improvement after EpiPen.  On arrival to the ED patient is slightly tachypneic, remainder of vitals are unremarkable.  He was evaluated by myself at bedside.  He is tolerating his own secretions without difficulty.  Phonating normally.  Speaking in full sentences.  No drooling.  His lungs are clear to auscultation bilaterally.  Uvula is midline.  Posterior oropharynx is clear.  Do not feel he requires additional epi at this time however will need to be monitored.  Given he is still symptomatic we will plan for Benadryl, Pepcid,  Solu-Medrol with reevaluation.  EKG obtained due to receiving epi.  Does show new right bundle branch block. No chest pain or SOB. Will have him follow up with PCP for same.  Pt monitored in the ED for > 3 hours (4 hours post epi) with improvement in his symptoms. He will be discharged home at this time with PCP follow up. He has  been provided with epi pen for future use PRN as well as benadryl and pepcid to help with allergic response. Strict return precautions have been discussed with pt. He is in agreement with plan and stable for discharge.  This note was prepared using Dragon voice recognition software and may include unintentional dictation errors due to the inherent limitations of voice recognition software.  Final Clinical Impression(s) / ED Diagnoses Final diagnoses:  Allergic reaction, initial encounter  EKG abnormalities    Rx / DC Orders ED Discharge Orders          Ordered    diphenhydrAMINE (BENADRYL) 25 MG tablet  Every 6 hours PRN        11/13/20 2020    famotidine (PEPCID) 20 MG tablet  2 times daily        11/13/20 2020    EPINEPHrine 0.3 mg/0.3 mL IJ SOAJ injection  As needed        11/13/20 2020             Discharge Instructions      Please pick up medication and take as prescribed over the next few days to help with your allergic response. I have also prescribed an epi pen for you in case you need it in the future for an allergic reaction.   Follow up with your PCP regarding ED visit today and for further eval of your EKG changes seen today. This may be normal for you however it was not seen on previous EKGs.   Return to the ED IMMEDIATELY for any new/worsening symptoms        Tanda Rockers, Cordelia Poche 11/13/20 2023    Gwyneth Sprout, MD 11/15/20 1319

## 2020-12-01 ENCOUNTER — Encounter (HOSPITAL_BASED_OUTPATIENT_CLINIC_OR_DEPARTMENT_OTHER): Payer: Self-pay | Admitting: Emergency Medicine

## 2020-12-01 ENCOUNTER — Other Ambulatory Visit: Payer: Self-pay

## 2020-12-01 ENCOUNTER — Emergency Department (HOSPITAL_BASED_OUTPATIENT_CLINIC_OR_DEPARTMENT_OTHER): Payer: BC Managed Care – PPO

## 2020-12-01 ENCOUNTER — Emergency Department (HOSPITAL_BASED_OUTPATIENT_CLINIC_OR_DEPARTMENT_OTHER)
Admission: EM | Admit: 2020-12-01 | Discharge: 2020-12-01 | Disposition: A | Discharge status: Home / Self Care | Payer: BC Managed Care – PPO | Attending: Emergency Medicine | Admitting: Emergency Medicine

## 2020-12-01 DIAGNOSIS — Z87891 Personal history of nicotine dependence: Secondary | ICD-10-CM | POA: Diagnosis not present

## 2020-12-01 DIAGNOSIS — E876 Hypokalemia: Secondary | ICD-10-CM | POA: Insufficient documentation

## 2020-12-01 DIAGNOSIS — R109 Unspecified abdominal pain: Secondary | ICD-10-CM | POA: Diagnosis not present

## 2020-12-01 DIAGNOSIS — I10 Essential (primary) hypertension: Secondary | ICD-10-CM | POA: Diagnosis not present

## 2020-12-01 DIAGNOSIS — R0789 Other chest pain: Secondary | ICD-10-CM | POA: Diagnosis not present

## 2020-12-01 DIAGNOSIS — Z79899 Other long term (current) drug therapy: Secondary | ICD-10-CM | POA: Diagnosis not present

## 2020-12-01 DIAGNOSIS — M62838 Other muscle spasm: Secondary | ICD-10-CM | POA: Diagnosis not present

## 2020-12-01 LAB — URINALYSIS, ROUTINE W REFLEX MICROSCOPIC
Bilirubin Urine: NEGATIVE
Glucose, UA: NEGATIVE mg/dL
Hgb urine dipstick: NEGATIVE
Ketones, ur: NEGATIVE mg/dL
Leukocytes,Ua: NEGATIVE
Nitrite: NEGATIVE
Protein, ur: NEGATIVE mg/dL
Specific Gravity, Urine: 1.015 (ref 1.005–1.030)
pH: 7.5 (ref 5.0–8.0)

## 2020-12-01 LAB — CBC WITH DIFFERENTIAL/PLATELET
Abs Immature Granulocytes: 0.01 10*3/uL (ref 0.00–0.07)
Basophils Absolute: 0.1 10*3/uL (ref 0.0–0.1)
Basophils Relative: 1 %
Eosinophils Absolute: 0.2 10*3/uL (ref 0.0–0.5)
Eosinophils Relative: 3 %
HCT: 38.5 % — ABNORMAL LOW (ref 39.0–52.0)
Hemoglobin: 13.4 g/dL (ref 13.0–17.0)
Immature Granulocytes: 0 %
Lymphocytes Relative: 23 %
Lymphs Abs: 1.4 10*3/uL (ref 0.7–4.0)
MCH: 28.5 pg (ref 26.0–34.0)
MCHC: 34.8 g/dL (ref 30.0–36.0)
MCV: 81.9 fL (ref 80.0–100.0)
Monocytes Absolute: 0.6 10*3/uL (ref 0.1–1.0)
Monocytes Relative: 10 %
Neutro Abs: 3.7 10*3/uL (ref 1.7–7.7)
Neutrophils Relative %: 63 %
Platelets: 260 10*3/uL (ref 150–400)
RBC: 4.7 MIL/uL (ref 4.22–5.81)
RDW: 12.7 % (ref 11.5–15.5)
WBC: 5.8 10*3/uL (ref 4.0–10.5)
nRBC: 0 % (ref 0.0–0.2)

## 2020-12-01 LAB — COMPREHENSIVE METABOLIC PANEL
ALT: 34 U/L (ref 0–44)
AST: 29 U/L (ref 15–41)
Albumin: 4.3 g/dL (ref 3.5–5.0)
Alkaline Phosphatase: 99 U/L (ref 38–126)
Anion gap: 10 (ref 5–15)
BUN: 9 mg/dL (ref 6–20)
CO2: 25 mmol/L (ref 22–32)
Calcium: 9.1 mg/dL (ref 8.9–10.3)
Chloride: 100 mmol/L (ref 98–111)
Creatinine, Ser: 0.82 mg/dL (ref 0.61–1.24)
GFR, Estimated: >60 mL/min (ref >60)
Glucose, Bld: 108 mg/dL — ABNORMAL HIGH (ref 70–99)
Potassium: 3.1 mmol/L — ABNORMAL LOW (ref 3.5–5.1)
Sodium: 135 mmol/L (ref 135–145)
Total Bilirubin: 0.8 mg/dL (ref 0.3–1.2)
Total Protein: 8.3 g/dL — ABNORMAL HIGH (ref 6.5–8.1)

## 2020-12-01 LAB — TROPONIN I (HIGH SENSITIVITY): Troponin I (High Sensitivity): 3 ng/L (ref <18)

## 2020-12-01 LAB — LIPASE, BLOOD: Lipase: 31 U/L (ref 11–51)

## 2020-12-01 MED ORDER — IOHEXOL 350 MG/ML SOLN
100.0000 mL | Freq: Once | INTRAVENOUS | Status: AC | PRN
  Administered 2020-12-01: 100 mL via INTRAVENOUS

## 2020-12-01 MED ORDER — POTASSIUM CHLORIDE CRYS ER 20 MEQ PO TBCR
40.0000 meq | EXTENDED_RELEASE_TABLET | Freq: Once | ORAL | Status: AC
  Administered 2020-12-01: 40 meq via ORAL
  Filled 2020-12-01: qty 2

## 2020-12-01 MED ORDER — MORPHINE SULFATE (PF) 4 MG/ML IV SOLN
4.0000 mg | Freq: Once | INTRAVENOUS | Status: AC
  Administered 2020-12-01: 4 mg via INTRAVENOUS
  Filled 2020-12-01: qty 1

## 2020-12-01 MED ORDER — HYDROMORPHONE HCL 1 MG/ML IJ SOLN
1.0000 mg | Freq: Once | INTRAMUSCULAR | Status: AC
  Administered 2020-12-01: 1 mg via INTRAVENOUS
  Filled 2020-12-01: qty 1

## 2020-12-01 MED ORDER — CYCLOBENZAPRINE HCL 10 MG PO TABS: 10.0000 mg | ORAL_TABLET | Freq: Two times a day (BID) | ORAL | 0 refills | Status: DC | PRN

## 2020-12-01 MED ORDER — KETOROLAC TROMETHAMINE 30 MG/ML IJ SOLN
30.0000 mg | Freq: Once | INTRAMUSCULAR | Status: AC
  Administered 2020-12-01: 30 mg via INTRAVENOUS
  Filled 2020-12-01: qty 1

## 2020-12-01 NOTE — ED Notes (Signed)
Pt NAD, a/ox4. Pt verbalizes understanding of all DC and f/u instructions. All questions answered. Pt walks with steady gait to lobby at DC with spouse.   

## 2020-12-01 NOTE — ED Notes (Signed)
Returned from CT.

## 2020-12-01 NOTE — ED Triage Notes (Addendum)
Pt reports abd pain that he noticed Wed, but became worse last night; he indicates that it is on both sides and is like a grabbing pain; denies NVD; pt continually grabbing sides and yelling out

## 2020-12-01 NOTE — ED Notes (Signed)
Presents with intermittent abd pain, spasms like per pt description, states he has tried Mild of Mag, denies nausea and vomiting, no diarrhea. Holds abd bilateral at rt and left side at base of ribs

## 2020-12-02 NOTE — ED Provider Notes (Signed)
[facility] EMERGENCY DEPARTMENT Provider Note   CSN: [id number] Arrival date & time: 12/01/20  1036     History Chief Complaint  Patient presents with   Abdominal Pain    Ricardo Heath is a 50 y.o. male.  HPI     50yo male with history of DVT, hypertension, spinal surgery who presents with concern for back/abdominal pain.  Wednesday noticed some pain starting, however significantly worsened last night. Now waxing and waning, severe, sharp pain, located bilateral flanks with radiation across abdomen.  Worse on left side. Comes on like contractions, severe pain.  No nausea, vomiting, diarrhea. No blood in urine or dysuria. No fevers.  No chest pain or dyspnea.   Past Medical History:  Diagnosis Date   Complication of anesthesia    a little combative after back surgery   DVT (deep venous thrombosis) (HCC)    GERD (gastroesophageal reflux disease)    Gout    Hypertension    Peripheral vascular disease (HCC)    blood clot left shoulder 2015   Pneumonia    Sleep apnea    uses cpap    Patient Active Problem List   Diagnosis Date Noted   Radiculopathy 06/07/2019   Lumbar herniated disc 08/29/2017   Right shoulder pain 06/28/2015   Forearm contusion 06/18/2015    Past Surgical History:  Procedure Laterality Date   ANTERIOR CERVICAL DECOMPRESSION/DISCECTOMY FUSION 4 LEVELS N/A 06/07/2019   Procedure: ANTERIOR CERVICAL DECOMPRESSION FUSION CERVICAL 4-5, CERVICAL 5-6, CERVICAL 6-7 WITH INSTRUMENTATION AND ALLOGRAFT;  Surgeon: Estill Bamberg, MD;  Location: [facility] OR;  Service: Orthopedics;  Laterality: N/A;   LUMBAR LAMINECTOMY/DECOMPRESSION MICRODISCECTOMY N/A 09/01/2017   Procedure: Lumbar four-five Laminectomy/Microdiscectomy;  Surgeon: Lisbeth Renshaw, MD;  Location: [facility] OR;  Service: Neurosurgery;  Laterality: N/A;       No family history on file.  Social History   Tobacco Use   Smoking status: Former    Types: Cigarettes   Smokeless tobacco: Never   Vaping Use   Vaping Use: Every day   Devices: smokes hemp for pain   Substance Use Topics   Alcohol use: Yes    Alcohol/week: 0.0 standard drinks    Comment: occ   Drug use: No    Home Medications Prior to Admission medications   Medication Sig Start Date End Date Taking? Authorizing Provider  cyclobenzaprine (FLEXERIL) 10 MG tablet Take 1 tablet (10 mg total) by mouth 2 (two) times daily as needed for muscle spasms. 12/01/20  Yes Alvira Monday, MD  atorvastatin (LIPITOR) 10 MG tablet Take 10 mg by mouth daily.    [provider]  benazepril (LOTENSIN) 40 MG tablet Take 40 mg by mouth daily.    [provider]  chlorthalidone (HYGROTON) 25 MG tablet Take 12.5 mg by mouth daily.    [provider]  diltiazem (CARDIZEM CD) 240 MG 24 hr capsule Take 240 mg by mouth daily.    [provider]  diphenhydrAMINE (BENADRYL) 25 MG tablet Take 1 tablet (25 mg total) by mouth every 6 (six) hours as needed. 11/13/20   Hyman Hopes, Margaux, PA-C  doxycycline (MONODOX) 100 MG capsule Take 100 mg by mouth 2 (two) times daily.    [provider]  EPINEPHrine 0.3 mg/0.3 mL IJ SOAJ injection Inject 0.3 mg into the muscle as needed for anaphylaxis. 11/13/20   Hyman Hopes, Margaux, PA-C  famotidine (PEPCID) 20 MG tablet Take 1 tablet (20 mg total) by mouth 2 (two) times daily. 11/13/20   Tanda Rockers,  PA-C  lidocaine (LIDODERM) 5 % Place 1 patch onto the skin daily. Remove & Discard patch within 12 hours or as directed by MD 03/31/20   Tegeler, Canary Brim, MD  oxyCODONE-acetaminophen (PERCOCET/ROXICET) 5-325 MG tablet Take 1-2 tablets by mouth every 4 (four) hours as needed for moderate pain or severe pain. 06/08/19   Estill Bamberg, MD  oxyCODONE-acetaminophen (PERCOCET/ROXICET) 5-325 MG tablet Take 1 tablet by mouth every 4 (four) hours as needed. 03/31/20   Tegeler, Canary Brim, MD  traMADol (ULTRAM) 50 MG tablet Take 50 mg by mouth every 6 (six) hours as needed for  pain. 05/16/19   [provider]  triamcinolone cream (KENALOG) 0.1 % Apply 1 application topically 2 (two) times daily.    [provider]    Allergies    Shellfish allergy and Promethazine  Review of Systems   Review of Systems  Constitutional:  Negative for fever.  Respiratory:  Negative for cough and shortness of breath.   Gastrointestinal:  Positive for abdominal pain. Negative for constipation, diarrhea, nausea and vomiting.  Genitourinary:  Negative for dysuria and hematuria.  Musculoskeletal:  Positive for back pain.  Skin:  Negative for rash.  Neurological:  Negative for syncope, weakness, numbness and headaches.   Physical Exam Updated Vital Signs BP 115/77 (BP Location: Right Arm)   Pulse 81   Temp 98.5 F (36.9 C) (Oral)   Resp 20   Ht 5\' 8"  (1.727 m)   Wt 113.4 kg   SpO2 99%   BMI 38.01 kg/m   Physical Exam Vitals and nursing note reviewed.  Constitutional:      General: He is in acute distress (in pain).     Appearance: Normal appearance. He is not ill-appearing, toxic-appearing or diaphoretic.  HENT:     Head: Normocephalic.  Eyes:     Conjunctiva/sclera: Conjunctivae normal.  Cardiovascular:     Rate and Rhythm: Normal rate and regular rhythm.     Pulses: Normal pulses.  Pulmonary:     Effort: Pulmonary effort is normal. No respiratory distress.  Abdominal:     Tenderness: There is abdominal tenderness (diffuse, bilateral upper abdomen). There is right CVA tenderness and left CVA tenderness.  Musculoskeletal:        General: No deformity or signs of injury.     Cervical back: No rigidity.  Skin:    General: Skin is warm and dry.     Coloration: Skin is not jaundiced or pale.  Neurological:     General: No focal deficit present.     Mental Status: He is alert and oriented to person, place, and time.    ED Results / Procedures / Treatments   Labs (all labs ordered are listed, but only abnormal results are displayed) Labs  Reviewed  CBC WITH DIFFERENTIAL/PLATELET - Abnormal; Notable for the following components:      Result Value   HCT 38.5 (*)    All other components within normal limits  COMPREHENSIVE METABOLIC PANEL - Abnormal; Notable for the following components:   Potassium 3.1 (*)    Glucose, Bld 108 (*)    Total Protein 8.3 (*)    All other components within normal limits  URINALYSIS, ROUTINE W REFLEX MICROSCOPIC  LIPASE, BLOOD  TROPONIN I (HIGH SENSITIVITY)    EKG EKG Interpretation  Date/Time:  Sunday December 01 2020 11:54:23 EDT Ventricular Rate:  98 PR Interval:  149 QRS Duration: 146 QT Interval:  370 QTC Calculation: 473 R Axis:   -26 Text  Interpretation: Sinus rhythm Right bundle branch block No significant change since last tracing Confirmed by Alvira Monday ([id number]) on 12/01/2020 1:24:40 PM  Radiology CT Angio Chest/Abd/Pel for Dissection W and/or Wo Contrast  Result Date: 12/01/2020 CLINICAL DATA:  Abdominal pain.  Suspect aortic dissection. EXAM: CT ANGIOGRAPHY CHEST, ABDOMEN AND PELVIS TECHNIQUE: Non-contrast CT of the chest was initially obtained. Multidetector CT imaging through the chest, abdomen and pelvis was performed using the standard protocol during bolus administration of intravenous contrast. Multiplanar reconstructed images and MIPs were obtained and reviewed to evaluate the vascular anatomy. CONTRAST:  OMNIPAQUE IOHEXOL 350 MG/ML SOLN COMPARISON:  March 04, 2013 FINDINGS: CTA CHEST FINDINGS Cardiovascular: Preferential opacification of the thoracic aorta. No evidence of thoracic aortic aneurysm or dissection. Normal heart size. No pericardial effusion. Mediastinum/Nodes: No enlarged mediastinal, hilar, or axillary lymph nodes. Thyroid gland, trachea, and esophagus demonstrate no significant findings. Residual thymic tissue is identified unchanged. Lungs/Pleura: Lungs are clear. No pleural effusion or pneumothorax. Minimal posterior dependent atelectasis of right  lung base is noted. Musculoskeletal: Degenerative joint changes of the spine are noted. Review of the MIP images confirms the above findings. CTA ABDOMEN AND PELVIS FINDINGS VASCULAR Aorta: Normal caliber aorta without aneurysm, dissection, vasculitis or significant stenosis. Celiac: Patent without evidence of aneurysm, dissection, vasculitis or significant stenosis. SMA: Patent without evidence of aneurysm, dissection, vasculitis or significant stenosis. Renals: Both renal arteries are patent without evidence of aneurysm, dissection, vasculitis, fibromuscular dysplasia or significant stenosis. IMA: Patent without evidence of aneurysm, dissection, vasculitis or significant stenosis. Inflow: Patent without evidence of aneurysm, dissection, vasculitis or significant stenosis. Veins: No obvious venous abnormality within the limitations of this arterial phase study. Review of the MIP images confirms the above findings. NON-VASCULAR Hepatobiliary: Small focal enhancement is identified in the anterior inferior liver unchanged compared to prior exam. No focal lesion is identified in the liver. The gallbladder and biliary tree are normal. Pancreas: Unremarkable. No pancreatic ductal dilatation or surrounding inflammatory changes. Spleen: Normal in size without focal abnormality. Adrenals/Urinary Tract: Adrenal glands are unremarkable. Kidneys are normal, without renal calculi, focal lesion, or hydronephrosis. Bladder is unremarkable. Stomach/Bowel: Stomach is within normal limits. Appendix appears normal. No evidence of bowel wall thickening, distention, or inflammatory changes. Lymphatic: Negative. Reproductive: Prostate is unremarkable. Other: None.  There is small umbilical herniation of mesenteric fat. Musculoskeletal: No acute abnormality. Review of the MIP images confirms the above findings. IMPRESSION: 1. No evidence of aortic aneurysm or dissection. 2. No acute abnormality identified in the chest, abdomen, and  pelvis. Electronically Signed   By: Sherian Rein M.D.   On: 12/01/2020 13:17    Procedures Procedures   Medications Ordered in ED Medications  morphine 4 MG/ML injection 4 mg (4 mg Intravenous Given 12/01/20 1149)  iohexol (OMNIPAQUE) 350 MG/ML injection 100 mL (100 mLs Intravenous Contrast Given 12/01/20 1248)  HYDROmorphone (DILAUDID) injection 1 mg (1 mg Intravenous Given 12/01/20 1307)  potassium chloride SA (KLOR-CON) CR tablet 40 mEq (40 mEq Oral Given 12/01/20 1336)  ketorolac (TORADOL) 30 MG/ML injection 30 mg (30 mg Intravenous Given 12/01/20 1535)    ED Course  I have reviewed the triage vital signs and the nursing notes.  Pertinent labs & imaging results that were available during my care of the patient were reviewed by me and considered in my medical decision making (see chart for details).    MDM Rules/Calculators/A&P  50yo male with history of DVT, hypertension, spinal surgery who presents with concern for back/abdominal pain.  In significant pain on arrival. Labs without pancreatitis or other acute abnormalities.  Mild hypokalemia. EKG without acute changes from previous, troponin normal, doubt ACS given normal troponin with duration of symptoms and history. No sign of UTI.   CT shows no evidence of dissection, nephrolithiasis, cholecystitis, perforation.  Suspect likely muscular spasm. Given rx for flexeril. Patient discharged in stable condition with understanding of reasons to return.    Final Clinical Impression(s) / ED Diagnoses Final diagnoses:  Muscle spasm    Rx / DC Orders ED Discharge Orders          Ordered    cyclobenzaprine (FLEXERIL) 10 MG tablet  2 times daily PRN        12/01/20 1532             Alvira Monday, MD 12/02/20 2331

## 2021-08-18 ENCOUNTER — Ambulatory Visit: Payer: Self-pay | Admitting: Student

## 2021-08-22 NOTE — Patient Instructions (Signed)
DUE TO COVID-19 ONLY TWO VISITORS  (aged 52 and older)  ARE ALLOWED TO COME WITH YOU AND STAY IN THE WAITING ROOM ONLY DURING PRE OP AND PROCEDURE.   **NO VISITORS ARE ALLOWED IN THE SHORT STAY AREA OR RECOVERY ROOM!!**   Your procedure is scheduled on: 09/04/21   Report to [facility] Main Entrance    Report to admitting at 5:15 AM   Call this number if you have problems the morning of surgery [phone]   Do not eat food :After Midnight.   After Midnight you may have the following liquids until 4:30 AM DAY OF SURGERY  Water Black Coffee (sugar ok, NO MILK/CREAM OR CREAMERS)  Tea (sugar ok, NO MILK/CREAM OR CREAMERS) regular and decaf                             Plain Jell-O (NO RED)                                           Fruit ices (not with fruit pulp, NO RED)                                     Popsicles (NO RED)                                                                  Juice: apple, WHITE grape, WHITE cranberry Sports drinks like Gatorade (NO RED) Clear broth(vegetable,chicken,beef)     The day of surgery:  Drink ONE (1) Pre-Surgery Clear Ensure at 4:30 AM the morning of surgery. Drink in one sitting. Do not sip.  This drink was given to you during your hospital  pre-op appointment visit. Nothing else to drink after completing the  Pre-Surgery Clear Ensure.          If you have questions, please contact your surgeon's office.   FOLLOW BOWEL PREP AND ANY ADDITIONAL PRE OP INSTRUCTIONS YOU RECEIVED FROM YOUR SURGEON'S OFFICE!!!     Oral Hygiene is also important to reduce your risk of infection.                                    Remember - BRUSH YOUR TEETH THE MORNING OF SURGERY WITH YOUR REGULAR TOOTHPASTE   Do NOT smoke after Midnight   Take these medicines the morning of surgery with A SIP OF WATER: Atorvastatin, Diltiazem, Pepcid, Norco, Tramadol  Bring CPAP mask and tubing day of surgery.                              You may not have any  metal on your body including jewelry, and body piercing             Do not wear lotions, powders, cologne, or deodorant              Men may shave face and neck.  Do not bring valuables to the hospital. [place] IS NOT             RESPONSIBLE   FOR VALUABLES.   Contacts, dentures or bridgework may not be worn into surgery.  DO NOT BRING YOUR HOME MEDICATIONS TO THE HOSPITAL. PHARMACY WILL DISPENSE MEDICATIONS LISTED ON YOUR MEDICATION LIST TO YOU DURING YOUR ADMISSION IN THE HOSPITAL!    Patients discharged on the day of surgery will not be allowed to drive home.  Someone NEEDS to stay with you for the first 24 hours after anesthesia.   Special Instructions: Bring a copy of your healthcare power of attorney and living will documents         the day of surgery if you haven't scanned them before.              Please read over the following fact sheets you were given: IF YOU HAVE QUESTIONS ABOUT YOUR PRE-OP INSTRUCTIONS PLEASE CALL [phone]- [facility] - Preparing for Surgery Before surgery, you can play an important role.  Because skin is not sterile, your skin needs to be as free of germs as possible.  You can reduce the number of germs on your skin by washing with CHG (chlorahexidine gluconate) soap before surgery.  CHG is an antiseptic cleaner which kills germs and bonds with the skin to continue killing germs even after washing. Please DO NOT use if you have an allergy to CHG or antibacterial soaps.  If your skin becomes reddened/irritated stop using the CHG and inform your nurse when you arrive at Short Stay. Do not shave (including legs and underarms) for at least 48 hours prior to the first CHG shower.  You may shave your face/neck.  Please follow these instructions carefully:  1.  Shower with CHG Soap the night before surgery and the  morning of surgery.  2.  If you choose to wash your hair, wash your hair first as usual with your normal  shampoo.  3.  After you  shampoo, rinse your hair and body thoroughly to remove the shampoo.                             4.  Use CHG as you would any other liquid soap.  You can apply chg directly to the skin and wash.  Gently with a scrungie or clean washcloth.  5.  Apply the CHG Soap to your body ONLY FROM THE NECK DOWN.   Do   not use on face/ open                           Wound or open sores. Avoid contact with eyes, ears mouth and   genitals (private parts).                       Wash face,  Genitals (private parts) with your normal soap.             6.  Wash thoroughly, paying special attention to the area where your    surgery  will be performed.  7.  Thoroughly rinse your body with warm water from the neck down.  8.  DO NOT shower/wash with your normal soap after using and rinsing off the CHG Soap.                9.  Pat yourself dry with a clean towel.            10.  Wear clean pajamas.            11.  Place clean sheets on your bed the night of your first shower and do not  sleep with pets. Day of Surgery : Do not apply any lotions/deodorants the morning of surgery.  Please wear clean clothes to the hospital/surgery center.  FAILURE TO FOLLOW THESE INSTRUCTIONS MAY RESULT IN THE CANCELLATION OF YOUR SURGERY  PATIENT SIGNATURE_________________________________  NURSE SIGNATURE__________________________________  ________________________________________________________________________   Rogelia Mire  An incentive spirometer is a tool that can help keep your lungs clear and active. This tool measures how well you are filling your lungs with each breath. Taking long deep breaths may help reverse or decrease the chance of developing breathing (pulmonary) problems (especially infection) following: A long period of time when you are unable to move or be active. BEFORE THE PROCEDURE  If the spirometer includes an indicator to show your best effort, your nurse or respiratory therapist will set it to a  desired goal. If possible, sit up straight or lean slightly forward. Try not to slouch. Hold the incentive spirometer in an upright position. INSTRUCTIONS FOR USE  Sit on the edge of your bed if possible, or sit up as far as you can in bed or on a chair. Hold the incentive spirometer in an upright position. Breathe out normally. Place the mouthpiece in your mouth and seal your lips tightly around it. Breathe in slowly and as deeply as possible, raising the piston or the ball toward the top of the column. Hold your breath for 3-5 seconds or for as long as possible. Allow the piston or ball to fall to the bottom of the column. Remove the mouthpiece from your mouth and breathe out normally. Rest for a few seconds and repeat Steps 1 through 7 at least 10 times every 1-2 hours when you are awake. Take your time and take a few normal breaths between deep breaths. The spirometer may include an indicator to show your best effort. Use the indicator as a goal to work toward during each repetition. After each set of 10 deep breaths, practice coughing to be sure your lungs are clear. If you have an incision (the cut made at the time of surgery), support your incision when coughing by placing a pillow or rolled up towels firmly against it. Once you are able to get out of bed, walk around indoors and cough well. You may stop using the incentive spirometer when instructed by your caregiver.  RISKS AND COMPLICATIONS Take your time so you do not get dizzy or light-headed. If you are in pain, you may need to take or ask for pain medication before doing incentive spirometry. It is harder to take a deep breath if you are having pain. AFTER USE Rest and breathe slowly and easily. It can be helpful to keep track of a log of your progress. Your caregiver can provide you with a simple table to help with this. If you are using the spirometer at home, follow these instructions: SEEK MEDICAL CARE IF:  You are having  difficultly using the spirometer. You have trouble using the spirometer as often as instructed. Your pain medication is not giving enough relief while using the spirometer. You develop fever of 100.5 F (38.1 C) or higher. SEEK IMMEDIATE MEDICAL CARE IF:  You cough up bloody sputum that had not  been present before. You develop fever of 102 F (38.9 C) or greater. You develop worsening pain at or near the incision site. MAKE SURE YOU:  Understand these instructions. Will watch your condition. Will get help right away if you are not doing well or get worse. Document Released: 06/22/2006 Document Revised: 05/04/2011 Document Reviewed: 08/23/2006 [employer] Patient Information 2014 Exit[employer], [place].   ________________________________________________________________________  WHAT IS A BLOOD TRANSFUSION? Blood Transfusion Information  A transfusion is the replacement of blood or some of its parts. Blood is made up of multiple cells which provide different functions. Red blood cells carry oxygen and are used for blood loss replacement. White blood cells fight against infection. Platelets control bleeding. Plasma helps clot blood. Other blood products are available for specialized needs, such as hemophilia or other clotting disorders. BEFORE THE TRANSFUSION  Who gives blood for transfusions?  Healthy volunteers who are fully evaluated to make sure their blood is safe. This is blood bank blood. Transfusion therapy is the safest it has ever been in the practice of medicine. Before blood is taken from a donor, a complete history is taken to make sure that person has no history of diseases nor engages in risky social behavior (examples are intravenous drug use or sexual activity with multiple partners). The donor's travel history is screened to minimize risk of transmitting infections, such as malaria. The donated blood is tested for signs of infectious diseases, such as HIV and hepatitis. The blood  is then tested to be sure it is compatible with you in order to minimize the chance of a transfusion reaction. If you or a relative donates blood, this is often done in anticipation of surgery and is not appropriate for emergency situations. It takes many days to process the donated blood. RISKS AND COMPLICATIONS Although transfusion therapy is very safe and saves many lives, the main dangers of transfusion include:  Getting an infectious disease. Developing a transfusion reaction. This is an allergic reaction to something in the blood you were given. Every precaution is taken to prevent this. The decision to have a blood transfusion has been considered carefully by your caregiver before blood is given. Blood is not given unless the benefits outweigh the risks. AFTER THE TRANSFUSION Right after receiving a blood transfusion, you will usually feel much better and more energetic. This is especially true if your red blood cells have gotten low (anemic). The transfusion raises the level of the red blood cells which carry oxygen, and this usually causes an energy increase. The nurse administering the transfusion will monitor you carefully for complications. HOME CARE INSTRUCTIONS  No special instructions are needed after a transfusion. You may find your energy is better. Speak with your caregiver about any limitations on activity for underlying diseases you may have. SEEK MEDICAL CARE IF:  Your condition is not improving after your transfusion. You develop redness or irritation at the intravenous (IV) site. SEEK IMMEDIATE MEDICAL CARE IF:  Any of the following symptoms occur over the next 12 hours: Shaking chills. You have a temperature by mouth above 102 F (38.9 C), not controlled by medicine. Chest, back, or muscle pain. People around you feel you are not acting correctly or are confused. Shortness of breath or difficulty breathing. Dizziness and fainting. You get a rash or develop hives. You  have a decrease in urine output. Your urine turns a dark color or changes to pink, red, or brown. Any of the following symptoms occur over the next 10 days: You  have a temperature by mouth above 102 F (38.9 C), not controlled by medicine. Shortness of breath. Weakness after normal activity. The white part of the eye turns yellow (jaundice). You have a decrease in the amount of urine or are urinating less often. Your urine turns a dark color or changes to pink, red, or brown. Document Released: 02/07/2000 Document Revised: 05/04/2011 Document Reviewed: 09/26/2007 [facility] Patient Information 2014 [place], [place].  _______________________________________________________________________

## 2021-08-22 NOTE — Progress Notes (Signed)
COVID Vaccine Completed:  Date of COVID positive in last 90 days:  PCP - Misty Stanley, PA Cardiologist -   Chest x-ray - 12/01/20 Epic EKG - 12/01/20 Epic Stress Test -  ECHO -  Cardiac Cath -  Pacemaker/ICD device last checked: Spinal Cord Stimulator:  Bowel Prep -   Sleep Study -  CPAP -   Fasting Blood Sugar -  Checks Blood Sugar _____ times a day  Blood Thinner Instructions: Aspirin Instructions: Last Dose:  Activity level:  Can go up a flight of stairs and perform activities of daily living without stopping and without symptoms of chest pain or shortness of breath.  Able to exercise without symptoms  Unable to go up a flight of stairs without symptoms of     Anesthesia review:   Patient denies shortness of breath, fever, cough and chest pain at PAT appointment  Patient verbalized understanding of instructions that were given to them at the PAT appointment. Patient was also instructed that they will need to review over the PAT instructions again at home before surgery.

## 2021-08-27 ENCOUNTER — Encounter (HOSPITAL_COMMUNITY)
Admission: RE | Admit: 2021-08-27 | Discharge: 2021-08-27 | Disposition: A | Discharge status: Home / Self Care | Payer: BC Managed Care – PPO | Source: Ambulatory Visit | Attending: Orthopedic Surgery | Admitting: Orthopedic Surgery

## 2021-08-27 ENCOUNTER — Encounter (HOSPITAL_COMMUNITY): Payer: Self-pay

## 2021-08-27 VITALS — BP 125/91 | HR 88 | Temp 98.7°F | Resp 12 | Ht 68.0 in | Wt 257.8 lb

## 2021-08-27 DIAGNOSIS — I2510 Atherosclerotic heart disease of native coronary artery without angina pectoris: Secondary | ICD-10-CM | POA: Insufficient documentation

## 2021-08-27 DIAGNOSIS — Z01812 Encounter for preprocedural laboratory examination: Secondary | ICD-10-CM | POA: Diagnosis present

## 2021-08-27 DIAGNOSIS — Z01818 Encounter for other preprocedural examination: Secondary | ICD-10-CM

## 2021-08-27 HISTORY — DX: Unspecified osteoarthritis, unspecified site: M19.90

## 2021-08-27 LAB — CBC
HCT: 41.4 % (ref 39.0–52.0)
Hemoglobin: 13.6 g/dL (ref 13.0–17.0)
MCH: 28.2 pg (ref 26.0–34.0)
MCHC: 32.9 g/dL (ref 30.0–36.0)
MCV: 85.7 fL (ref 80.0–100.0)
Platelets: 223 10*3/uL (ref 150–400)
RBC: 4.83 MIL/uL (ref 4.22–5.81)
RDW: 13.2 % (ref 11.5–15.5)
WBC: 5.9 10*3/uL (ref 4.0–10.5)
nRBC: 0 % (ref 0.0–0.2)

## 2021-08-27 LAB — BASIC METABOLIC PANEL
Anion gap: 7 (ref 5–15)
BUN: 10 mg/dL (ref 6–20)
CO2: 29 mmol/L (ref 22–32)
Calcium: 9.4 mg/dL (ref 8.9–10.3)
Chloride: 105 mmol/L (ref 98–111)
Creatinine, Ser: 0.87 mg/dL (ref 0.61–1.24)
GFR, Estimated: >60 mL/min (ref >60)
Glucose, Bld: 107 mg/dL — ABNORMAL HIGH (ref 70–99)
Potassium: 3.7 mmol/L (ref 3.5–5.1)
Sodium: 141 mmol/L (ref 135–145)

## 2021-08-27 LAB — SURGICAL PCR SCREEN
MRSA, PCR: NEGATIVE
Staphylococcus aureus: NEGATIVE

## 2021-08-30 ENCOUNTER — Ambulatory Visit: Payer: Self-pay | Admitting: Student

## 2021-08-30 NOTE — H&P (View-Only) (Signed)
TOTAL HIP ADMISSION H&P  Patient is admitted for right total hip arthroplasty.  Subjective:  Chief Complaint: right hip pain  HPI: Ricardo Heath, 51 y.o. male, has a history of pain and functional disability in the right hip(s) due to arthritis and patient has failed non-surgical conservative treatments for greater than 12 weeks to include NSAID's and/or analgesics, corticosteriod injections, flexibility and strengthening excercises, supervised PT with diminished ADL's post treatment, weight reduction as appropriate, and activity modification.  Onset of symptoms was gradual starting 10 years ago with rapidlly worsening course since that time.The patient noted no past surgery on the right hip(s).  Patient currently rates pain in the right hip at 10 out of 10 with activity. Patient has night pain, worsening of pain with activity and weight bearing, trendelenberg gait, pain that interfers with activities of daily living, and pain with passive range of motion. Patient has evidence of subchondral cysts, subchondral sclerosis, periarticular osteophytes, and joint space narrowing by imaging studies. This condition presents safety issues increasing the risk of falls. There is no current active infection.  Patient Active Problem List   Diagnosis Date Noted   Radiculopathy 06/07/2019   Lumbar herniated disc 08/29/2017   Right shoulder pain 06/28/2015   Forearm contusion 06/18/2015   Past Medical History:  Diagnosis Date   Arthritis    Complication of anesthesia    a little combative after back surgery   DVT (deep venous thrombosis) (HCC)    GERD (gastroesophageal reflux disease)    Gout    Hypertension    Peripheral vascular disease (HCC)    blood clot left shoulder 2015   Pneumonia    Sleep apnea    uses cpap    Past Surgical History:  Procedure Laterality Date   ANTERIOR CERVICAL DECOMPRESSION/DISCECTOMY FUSION 4 LEVELS N/A 06/07/2019   Procedure: ANTERIOR CERVICAL DECOMPRESSION FUSION  CERVICAL 4-5, CERVICAL 5-6, CERVICAL 6-7 WITH INSTRUMENTATION AND ALLOGRAFT;  Surgeon: Estill Bamberg, MD;  Location: [facility] OR;  Service: Orthopedics;  Laterality: N/A;   LUMBAR LAMINECTOMY/DECOMPRESSION MICRODISCECTOMY N/A 09/01/2017   Procedure: Lumbar four-five Laminectomy/Microdiscectomy;  Surgeon: Lisbeth Renshaw, MD;  Location: [facility] OR;  Service: Neurosurgery;  Laterality: N/A;   WISDOM TOOTH EXTRACTION      Current Outpatient Medications  Medication Sig Dispense Refill Last Dose   atorvastatin (LIPITOR) 10 MG tablet Take 10 mg by mouth daily.      benazepril (LOTENSIN) 40 MG tablet Take 40 mg by mouth daily.      chlorthalidone (HYGROTON) 25 MG tablet Take 12.5 mg by mouth daily.      cyclobenzaprine (FLEXERIL) 10 MG tablet Take 1 tablet (10 mg total) by mouth 2 (two) times daily as needed for muscle spasms. (Patient taking differently: Take 10 mg by mouth 3 (three) times daily.) 20 tablet 0    diltiazem (CARDIZEM CD) 240 MG 24 hr capsule Take 240 mg by mouth daily.      diphenhydrAMINE (BENADRYL) 25 MG tablet Take 1 tablet (25 mg total) by mouth every 6 (six) hours as needed. 30 tablet 0    EPINEPHrine 0.3 mg/0.3 mL IJ SOAJ injection Inject 0.3 mg into the muscle as needed for anaphylaxis. 1 each 0    famotidine (PEPCID) 20 MG tablet Take 1 tablet (20 mg total) by mouth 2 (two) times daily. (Patient taking differently: Take 20 mg by mouth 2 (two) times daily as needed for heartburn or indigestion.) 30 tablet 0    HYDROcodone-acetaminophen (NORCO) 10-325 MG tablet Take 1 tablet by mouth  3 (three) times daily as needed for severe pain.      traMADol (ULTRAM) 50 MG tablet Take 50 mg by mouth every 6 (six) hours as needed for pain.      triamcinolone cream (KENALOG) 0.1 % Apply 1 application  topically 2 (two) times daily as needed (irritation).      No current facility-administered medications for this visit.   Allergies  Allergen Reactions   Shellfish Allergy Swelling   Promethazine  Palpitations    Jittery     Social History   Tobacco Use   Smoking status: Former    Types: Cigarettes   Smokeless tobacco: Never  Substance Use Topics   Alcohol use: Yes    Alcohol/week: 0.0 standard drinks of alcohol    Comment: occ    No family history on file.   Review of Systems  Musculoskeletal:  Positive for arthralgias, back pain, gait problem, joint swelling, myalgias, neck pain and neck stiffness.  All other systems reviewed and are negative.   Objective:  Physical Exam Constitutional:      Appearance: Normal appearance.  HENT:     Head: Normocephalic and atraumatic.     Nose: Nose normal.     Mouth/Throat:     Mouth: Mucous membranes are moist.     Pharynx: Oropharynx is clear.  Eyes:     Extraocular Movements: Extraocular movements intact.  Cardiovascular:     Rate and Rhythm: Normal rate and regular rhythm.     Pulses: Normal pulses.     Heart sounds: Normal heart sounds.  Pulmonary:     Effort: Pulmonary effort is normal.     Breath sounds: Normal breath sounds.  Abdominal:     General: Abdomen is flat.     Palpations: Abdomen is soft.  Genitourinary:    Comments: deferred Musculoskeletal:     Cervical back: Neck supple.     Comments: Examination of the right hip reveals no skin wounds, lesions, rashes, or erythema. Pain with flexion and rotation of the hip, his motion is severely restricted. Mild tenderness to palpation over trochanteric region.   Sensory and motor function intact in LE bilaterally. Distal pulses 2+ bilaterally.  No significant pedal edema. Calves soft and non-tender.   Skin:    General: Skin is warm and dry.     Capillary Refill: Capillary refill takes less than 2 seconds.  Neurological:     General: No focal deficit present.     Mental Status: He is alert and oriented to person, place, and time.  Psychiatric:        Mood and Affect: Mood normal.        Behavior: Behavior normal.        Thought Content: Thought content  normal.        Judgment: Judgment normal.     Vital signs in last 24 hours: @VSRANGES @  Labs:   Estimated body mass index is 39.2 kg/m as calculated from the following:   Height as of 08/27/21: 5\' 8"  (1.727 m).   Weight as of 08/27/21: 116.9 kg.   Imaging Review Plain radiographs demonstrate severe degenerative joint disease of the right hip(s). The bone quality appears to be adequate for age and reported activity level.      Assessment/Plan:  End stage arthritis, right hip(s)  The patient history, physical examination, clinical judgement of the provider and imaging studies are consistent with end stage degenerative joint disease of the right hip(s) and total hip arthroplasty is deemed medically  necessary. The treatment options including medical management, injection therapy, arthroscopy and arthroplasty were discussed at length. The risks and benefits of total hip arthroplasty were presented and reviewed. The risks due to aseptic loosening, infection, stiffness, dislocation/subluxation,  thromboembolic complications and other imponderables were discussed.  The patient acknowledged the explanation, agreed to proceed with the plan and consent was signed. Patient is being admitted for inpatient treatment for surgery, pain control, PT, OT, prophylactic antibiotics, VTE prophylaxis, progressive ambulation and ADL's and discharge planning.The patient is planning to be discharged home with HEP.  Therapy Plans: HEP.  Disposition: Home with wife Planned DVT Prophylaxis: Eliquis 2.5mg  BID DME needed: walker.  PCP: Cleared.  TXA: Yes Allergies:  - Phenergan (promethazine) - chest pain,  - Shellfish - Anaphylaxis, facial swelling, nausea, vomiting, cough, flushing.  Anesthesia Concerns: None. Has had trouble waking up in the past with some anger.  BMI: 38.5 Last HgbA1c: 5.7 Other: - History of DVT in LUE.  - OSA, uses CPAP - Pain management with Dr. Ethelene Hal, hydrocodone 10/325 2-3 daily.   - Meloxicam - Surgery on lower back 2019. Neck fusion 2020.  - Hydrocodone, methocarbamol, zofran.    Patient's anticipated LOS is less than 2 midnights, meeting these requirements: - Younger than 66 - Lives within 1 hour of care - Has a competent adult at home to recover with post-op recover - NO history of  - Chronic pain requiring opiods  - Diabetes  - Coronary Artery Disease  - Heart failure  - Heart attack  - Stroke  - DVT/VTE  - Cardiac arrhythmia  - Respiratory Failure/COPD  - Renal failure  - Anemia  - Advanced Liver disease

## 2021-08-30 NOTE — H&P (Signed)
TOTAL HIP ADMISSION H&P  Patient is admitted for right total hip arthroplasty.  Subjective:  Chief Complaint: right hip pain  HPI: Ricardo Heath, 51 y.o. male, has a history of pain and functional disability in the right hip(s) due to arthritis and patient has failed non-surgical conservative treatments for greater than 12 weeks to include NSAID's and/or analgesics, corticosteriod injections, flexibility and strengthening excercises, supervised PT with diminished ADL's post treatment, weight reduction as appropriate, and activity modification.  Onset of symptoms was gradual starting 10 years ago with rapidlly worsening course since that time.The patient noted no past surgery on the right hip(s).  Patient currently rates pain in the right hip at 10 out of 10 with activity. Patient has night pain, worsening of pain with activity and weight bearing, trendelenberg gait, pain that interfers with activities of daily living, and pain with passive range of motion. Patient has evidence of subchondral cysts, subchondral sclerosis, periarticular osteophytes, and joint space narrowing by imaging studies. This condition presents safety issues increasing the risk of falls. There is no current active infection.  Patient Active Problem List   Diagnosis Date Noted   Radiculopathy 06/07/2019   Lumbar herniated disc 08/29/2017   Right shoulder pain 06/28/2015   Forearm contusion 06/18/2015   Past Medical History:  Diagnosis Date   Arthritis    Complication of anesthesia    a little combative after back surgery   DVT (deep venous thrombosis) (HCC)    GERD (gastroesophageal reflux disease)    Gout    Hypertension    Peripheral vascular disease (HCC)    blood clot left shoulder 2015   Pneumonia    Sleep apnea    uses cpap    Past Surgical History:  Procedure Laterality Date   ANTERIOR CERVICAL DECOMPRESSION/DISCECTOMY FUSION 4 LEVELS N/A 06/07/2019   Procedure: ANTERIOR CERVICAL DECOMPRESSION FUSION  CERVICAL 4-5, CERVICAL 5-6, CERVICAL 6-7 WITH INSTRUMENTATION AND ALLOGRAFT;  Surgeon: Estill Bamberg, MD;  Location: [facility] OR;  Service: Orthopedics;  Laterality: N/A;   LUMBAR LAMINECTOMY/DECOMPRESSION MICRODISCECTOMY N/A 09/01/2017   Procedure: Lumbar four-five Laminectomy/Microdiscectomy;  Surgeon: Lisbeth Renshaw, MD;  Location: [facility] OR;  Service: Neurosurgery;  Laterality: N/A;   WISDOM TOOTH EXTRACTION      Current Outpatient Medications  Medication Sig Dispense Refill Last Dose   atorvastatin (LIPITOR) 10 MG tablet Take 10 mg by mouth daily.      benazepril (LOTENSIN) 40 MG tablet Take 40 mg by mouth daily.      chlorthalidone (HYGROTON) 25 MG tablet Take 12.5 mg by mouth daily.      cyclobenzaprine (FLEXERIL) 10 MG tablet Take 1 tablet (10 mg total) by mouth 2 (two) times daily as needed for muscle spasms. (Patient taking differently: Take 10 mg by mouth 3 (three) times daily.) 20 tablet 0    diltiazem (CARDIZEM CD) 240 MG 24 hr capsule Take 240 mg by mouth daily.      diphenhydrAMINE (BENADRYL) 25 MG tablet Take 1 tablet (25 mg total) by mouth every 6 (six) hours as needed. 30 tablet 0    EPINEPHrine 0.3 mg/0.3 mL IJ SOAJ injection Inject 0.3 mg into the muscle as needed for anaphylaxis. 1 each 0    famotidine (PEPCID) 20 MG tablet Take 1 tablet (20 mg total) by mouth 2 (two) times daily. (Patient taking differently: Take 20 mg by mouth 2 (two) times daily as needed for heartburn or indigestion.) 30 tablet 0    HYDROcodone-acetaminophen (NORCO) 10-325 MG tablet Take 1 tablet by mouth  3 (three) times daily as needed for severe pain.      traMADol (ULTRAM) 50 MG tablet Take 50 mg by mouth every 6 (six) hours as needed for pain.      triamcinolone cream (KENALOG) 0.1 % Apply 1 application  topically 2 (two) times daily as needed (irritation).      No current facility-administered medications for this visit.   Allergies  Allergen Reactions   Shellfish Allergy Swelling   Promethazine  Palpitations    Jittery     Social History   Tobacco Use   Smoking status: Former    Types: Cigarettes   Smokeless tobacco: Never  Substance Use Topics   Alcohol use: Yes    Alcohol/week: 0.0 standard drinks of alcohol    Comment: occ    No family history on file.   Review of Systems  Musculoskeletal:  Positive for arthralgias, back pain, gait problem, joint swelling, myalgias, neck pain and neck stiffness.  All other systems reviewed and are negative.   Objective:  Physical Exam Constitutional:      Appearance: Normal appearance.  HENT:     Head: Normocephalic and atraumatic.     Nose: Nose normal.     Mouth/Throat:     Mouth: Mucous membranes are moist.     Pharynx: Oropharynx is clear.  Eyes:     Extraocular Movements: Extraocular movements intact.  Cardiovascular:     Rate and Rhythm: Normal rate and regular rhythm.     Pulses: Normal pulses.     Heart sounds: Normal heart sounds.  Pulmonary:     Effort: Pulmonary effort is normal.     Breath sounds: Normal breath sounds.  Abdominal:     General: Abdomen is flat.     Palpations: Abdomen is soft.  Genitourinary:    Comments: deferred Musculoskeletal:     Cervical back: Rigidity and tenderness present.     Comments: Examination of the right hip reveals no skin wounds, lesions, rashes, or erythema. Pain with flexion and rotation of the hip, his motion is severely restricted. Mild tenderness to palpation over trochanteric region.   Sensory and motor function intact in LE bilaterally. Distal pulses 2+ bilaterally.  No significant pedal edema. Calves soft and non-tender.   Skin:    General: Skin is warm and dry.     Capillary Refill: Capillary refill takes less than 2 seconds.  Neurological:     General: No focal deficit present.     Mental Status: He is alert and oriented to person, place, and time.  Psychiatric:        Mood and Affect: Mood normal.        Behavior: Behavior normal.        Thought Content:  Thought content normal.        Judgment: Judgment normal.     Vital signs in last 24 hours: @VSRANGES @  Labs:   Estimated body mass index is 39.2 kg/m as calculated from the following:   Height as of 08/27/21: 5\' 8"  (1.727 m).   Weight as of 08/27/21: 116.9 kg.   Imaging Review Plain radiographs demonstrate severe degenerative joint disease of the right hip(s). The bone quality appears to be adequate for age and reported activity level.      Assessment/Plan:  End stage arthritis, right hip(s)  The patient history, physical examination, clinical judgement of the provider and imaging studies are consistent with end stage degenerative joint disease of the right hip(s) and total hip arthroplasty is  deemed medically necessary. The treatment options including medical management, injection therapy, arthroscopy and arthroplasty were discussed at length. The risks and benefits of total hip arthroplasty were presented and reviewed. The risks due to aseptic loosening, infection, stiffness, dislocation/subluxation,  thromboembolic complications and other imponderables were discussed.  The patient acknowledged the explanation, agreed to proceed with the plan and consent was signed. Patient is being admitted for inpatient treatment for surgery, pain control, PT, OT, prophylactic antibiotics, VTE prophylaxis, progressive ambulation and ADL's and discharge planning.The patient is planning to be discharged home with HEP.  Therapy Plans: HEP.  Disposition: Home with wife Planned DVT Prophylaxis: Eliquis 2.5mg  BID DME needed: walker.  PCP: Cleared.  TXA: Yes Allergies:  - Phenergan (promethazine) - chest pain,  - Shellfish - Anaphylaxis, facial swelling, nausea, vomiting, cough, flushing.  Anesthesia Concerns: None. Has had trouble waking up in the past with some anger.  BMI: 38.5 Last HgbA1c: 5.7 Other: - History of DVT in LUE.  - OSA, uses CPAP - Pain management with Dr. Ethelene Hal, hydrocodone  10/325 2-3 daily.  - Meloxicam - Surgery on lower back 2019. Neck fusion 2020.  - Hydrocodone, methocarbamol, zofran.    Patient's anticipated LOS is less than 2 midnights, meeting these requirements: - Younger than 64 - Lives within 1 hour of care - Has a competent adult at home to recover with post-op recover - NO history of  - Chronic pain requiring opiods  - Diabetes  - Coronary Artery Disease  - Heart failure  - Heart attack  - Stroke  - DVT/VTE  - Cardiac arrhythmia  - Respiratory Failure/COPD  - Renal failure  - Anemia  - Advanced Liver disease

## 2021-09-03 NOTE — Anesthesia Preprocedure Evaluation (Addendum)
Anesthesia Evaluation  Patient identified by MRN, date of birth, ID band Patient awake    Reviewed: Allergy & Precautions, NPO status , Patient's Chart, lab work & pertinent test results  History of Anesthesia Complications (+) Emergence Delirium and history of anesthetic complications  Airway Mallampati: III  TM Distance: >3 FB Neck ROM: Full    Dental  (+) Teeth Intact, Dental Advisory Given   Pulmonary sleep apnea and Continuous Positive Airway Pressure Ventilation , former smoker,    Pulmonary exam normal breath sounds clear to auscultation       Cardiovascular hypertension, Pt. on medications + Peripheral Vascular Disease and + DVT  Normal cardiovascular exam Rhythm:Regular Rate:Normal     Neuro/Psych S/p ACDF  Neuromuscular disease    GI/Hepatic Neg liver ROS, GERD  Medicated,  Endo/Other  Obesity   Renal/GU negative Renal ROS     Musculoskeletal  (+) Arthritis ,   Abdominal   Peds  Hematology negative hematology ROS (+) Plt 223k   Anesthesia Other Findings   Reproductive/Obstetrics                            Anesthesia Physical Anesthesia Plan  ASA: 2  Anesthesia Plan: Spinal   Post-op Pain Management: Tylenol PO (pre-op)*   Induction: Intravenous  PONV Risk Score and Plan: 1 and Ondansetron, Dexamethasone, TIVA and Treatment may vary due to age or medical condition  Airway Management Planned: Nasal Cannula and Natural Airway  Additional Equipment:   Intra-op Plan:   Post-operative Plan:   Informed Consent: I have reviewed the patients History and Physical, chart, labs and discussed the procedure including the risks, benefits and alternatives for the proposed anesthesia with the patient or authorized representative who has indicated his/her understanding and acceptance.     Dental advisory given  Plan Discussed with: CRNA  Anesthesia Plan Comments:          Anesthesia Quick Evaluation

## 2021-09-04 ENCOUNTER — Ambulatory Visit (HOSPITAL_COMMUNITY): Payer: BC Managed Care – PPO

## 2021-09-04 ENCOUNTER — Encounter (HOSPITAL_COMMUNITY)
Admission: RE | Disposition: A | Discharge status: Home / Self Care | Payer: Self-pay | Source: Ambulatory Visit | Attending: Orthopedic Surgery

## 2021-09-04 ENCOUNTER — Ambulatory Visit (HOSPITAL_COMMUNITY)
Admission: RE | Admit: 2021-09-04 | Discharge: 2021-09-04 | Disposition: A | Discharge status: Home / Self Care | Payer: BC Managed Care – PPO | Source: Ambulatory Visit | Attending: Orthopedic Surgery | Admitting: Orthopedic Surgery

## 2021-09-04 ENCOUNTER — Other Ambulatory Visit: Payer: Self-pay

## 2021-09-04 ENCOUNTER — Encounter (HOSPITAL_COMMUNITY): Payer: Self-pay | Admitting: Orthopedic Surgery

## 2021-09-04 ENCOUNTER — Ambulatory Visit (HOSPITAL_COMMUNITY): Payer: BC Managed Care – PPO | Admitting: Anesthesiology

## 2021-09-04 DIAGNOSIS — Z87891 Personal history of nicotine dependence: Secondary | ICD-10-CM | POA: Diagnosis not present

## 2021-09-04 DIAGNOSIS — Z79899 Other long term (current) drug therapy: Secondary | ICD-10-CM | POA: Insufficient documentation

## 2021-09-04 DIAGNOSIS — Z96641 Presence of right artificial hip joint: Secondary | ICD-10-CM

## 2021-09-04 DIAGNOSIS — G4730 Sleep apnea, unspecified: Secondary | ICD-10-CM | POA: Insufficient documentation

## 2021-09-04 DIAGNOSIS — M1611 Unilateral primary osteoarthritis, right hip: Secondary | ICD-10-CM | POA: Diagnosis present

## 2021-09-04 DIAGNOSIS — Z86718 Personal history of other venous thrombosis and embolism: Secondary | ICD-10-CM | POA: Insufficient documentation

## 2021-09-04 DIAGNOSIS — G709 Myoneural disorder, unspecified: Secondary | ICD-10-CM | POA: Insufficient documentation

## 2021-09-04 DIAGNOSIS — Z981 Arthrodesis status: Secondary | ICD-10-CM | POA: Insufficient documentation

## 2021-09-04 DIAGNOSIS — I739 Peripheral vascular disease, unspecified: Secondary | ICD-10-CM | POA: Insufficient documentation

## 2021-09-04 DIAGNOSIS — K219 Gastro-esophageal reflux disease without esophagitis: Secondary | ICD-10-CM | POA: Insufficient documentation

## 2021-09-04 DIAGNOSIS — Z6839 Body mass index (BMI) 39.0-39.9, adult: Secondary | ICD-10-CM | POA: Diagnosis not present

## 2021-09-04 DIAGNOSIS — I10 Essential (primary) hypertension: Secondary | ICD-10-CM | POA: Diagnosis not present

## 2021-09-04 DIAGNOSIS — E669 Obesity, unspecified: Secondary | ICD-10-CM | POA: Diagnosis not present

## 2021-09-04 HISTORY — PX: TOTAL HIP ARTHROPLASTY: SHX124

## 2021-09-04 LAB — TYPE AND SCREEN
ABO/RH(D): O POS
Antibody Screen: NEGATIVE

## 2021-09-04 SURGERY — ARTHROPLASTY, HIP, TOTAL, ANTERIOR APPROACH
Anesthesia: Spinal | Site: Hip | Laterality: Right

## 2021-09-04 MED ORDER — PROPOFOL 10 MG/ML IV BOLUS
INTRAVENOUS | Status: AC
  Filled 2021-09-04: qty 20

## 2021-09-04 MED ORDER — POLYETHYLENE GLYCOL 3350 17 G PO PACK: 17.0000 g | PACK | Freq: Every day | ORAL | 0 refills | Status: AC | PRN

## 2021-09-04 MED ORDER — PHENYLEPHRINE 80 MCG/ML (10ML) SYRINGE FOR IV PUSH (FOR BLOOD PRESSURE SUPPORT)
PREFILLED_SYRINGE | INTRAVENOUS | Status: AC
  Filled 2021-09-04: qty 10

## 2021-09-04 MED ORDER — PHENYLEPHRINE 80 MCG/ML (10ML) SYRINGE FOR IV PUSH (FOR BLOOD PRESSURE SUPPORT)
PREFILLED_SYRINGE | INTRAVENOUS | Status: DC | PRN
  Administered 2021-09-04: 160 ug via INTRAVENOUS
  Administered 2021-09-04: 80 ug via INTRAVENOUS
  Administered 2021-09-04: 160 ug via INTRAVENOUS

## 2021-09-04 MED ORDER — ONDANSETRON HCL 4 MG PO TABS: 4.0000 mg | ORAL_TABLET | Freq: Three times a day (TID) | ORAL | 0 refills | Status: AC | PRN

## 2021-09-04 MED ORDER — ONDANSETRON HCL 4 MG/2ML IJ SOLN: 4.0000 mg | Freq: Once | INTRAMUSCULAR | Status: DC | PRN

## 2021-09-04 MED ORDER — ALBUMIN HUMAN 5 % IV SOLN: INTRAVENOUS | Status: DC | PRN

## 2021-09-04 MED ORDER — ORAL CARE MOUTH RINSE: 15.0000 mL | Freq: Once | OROMUCOSAL | Status: AC

## 2021-09-04 MED ORDER — ISOPROPYL ALCOHOL 70 % SOLN
Status: DC | PRN
  Administered 2021-09-04: 1 via TOPICAL

## 2021-09-04 MED ORDER — MIDAZOLAM HCL 2 MG/2ML IJ SOLN
INTRAMUSCULAR | Status: AC
  Filled 2021-09-04: qty 2

## 2021-09-04 MED ORDER — DOCUSATE SODIUM 100 MG PO CAPS: 100.0000 mg | ORAL_CAPSULE | Freq: Two times a day (BID) | ORAL | 0 refills | Status: AC

## 2021-09-04 MED ORDER — KETOROLAC TROMETHAMINE 30 MG/ML IJ SOLN
INTRAMUSCULAR | Status: AC
  Filled 2021-09-04: qty 1

## 2021-09-04 MED ORDER — PHENYLEPHRINE HCL-NACL 20-0.9 MG/250ML-% IV SOLN
INTRAVENOUS | Status: DC | PRN
  Administered 2021-09-04: 50 ug/min via INTRAVENOUS

## 2021-09-04 MED ORDER — SODIUM CHLORIDE (PF) 0.9 % IJ SOLN
INTRAMUSCULAR | Status: DC | PRN
  Administered 2021-09-04: 30 mL via INTRAVENOUS

## 2021-09-04 MED ORDER — FENTANYL CITRATE PF 50 MCG/ML IJ SOSY: 25.0000 ug | PREFILLED_SYRINGE | INTRAMUSCULAR | Status: DC | PRN

## 2021-09-04 MED ORDER — EPHEDRINE 5 MG/ML INJ
INTRAVENOUS | Status: AC
  Filled 2021-09-04: qty 5

## 2021-09-04 MED ORDER — PHENYLEPHRINE HCL-NACL 20-0.9 MG/250ML-% IV SOLN
INTRAVENOUS | Status: AC
  Filled 2021-09-04: qty 750

## 2021-09-04 MED ORDER — POVIDONE-IODINE 10 % EX SWAB
2.0000 | Freq: Once | CUTANEOUS | Status: AC
  Administered 2021-09-04: 2 via TOPICAL

## 2021-09-04 MED ORDER — DEXAMETHASONE SODIUM PHOSPHATE 10 MG/ML IJ SOLN
INTRAMUSCULAR | Status: DC | PRN
  Administered 2021-09-04: 10 mg via INTRAVENOUS

## 2021-09-04 MED ORDER — HYDROCODONE-ACETAMINOPHEN 10-325 MG PO TABS: 1.0000 | ORAL_TABLET | ORAL | 0 refills | Status: AC | PRN

## 2021-09-04 MED ORDER — POVIDONE-IODINE 10 % EX SWAB: 2.0000 | Freq: Once | CUTANEOUS | Status: DC

## 2021-09-04 MED ORDER — LACTATED RINGERS IV BOLUS
250.0000 mL | Freq: Once | INTRAVENOUS | Status: AC
Start: 2021-09-04 — End: 2021-09-04
  Administered 2021-09-04: 250 mL via INTRAVENOUS

## 2021-09-04 MED ORDER — CEFAZOLIN SODIUM-DEXTROSE 2-4 GM/100ML-% IV SOLN
INTRAVENOUS | Status: AC
  Administered 2021-09-04: 2 g via INTRAVENOUS
  Filled 2021-09-04: qty 100

## 2021-09-04 MED ORDER — BUPIVACAINE-EPINEPHRINE (PF) 0.25% -1:200000 IJ SOLN
INTRAMUSCULAR | Status: AC
  Filled 2021-09-04: qty 30

## 2021-09-04 MED ORDER — SODIUM CHLORIDE (PF) 0.9 % IJ SOLN
INTRAMUSCULAR | Status: AC
  Filled 2021-09-04: qty 50

## 2021-09-04 MED ORDER — FENTANYL CITRATE (PF) 250 MCG/5ML IJ SOLN
INTRAMUSCULAR | Status: DC | PRN
  Administered 2021-09-04 (×2): 50 ug via INTRAVENOUS

## 2021-09-04 MED ORDER — WATER FOR IRRIGATION, STERILE IR SOLN
Status: DC | PRN
  Administered 2021-09-04: 2000 mL

## 2021-09-04 MED ORDER — APIXABAN 2.5 MG PO TABS: 2.5000 mg | ORAL_TABLET | Freq: Two times a day (BID) | ORAL | 0 refills | Status: AC

## 2021-09-04 MED ORDER — ACETAMINOPHEN 500 MG PO TABS
1000.0000 mg | ORAL_TABLET | Freq: Once | ORAL | Status: AC
  Administered 2021-09-04: 1000 mg via ORAL
  Filled 2021-09-04: qty 2

## 2021-09-04 MED ORDER — EPHEDRINE SULFATE-NACL 50-0.9 MG/10ML-% IV SOSY
PREFILLED_SYRINGE | INTRAVENOUS | Status: DC | PRN
  Administered 2021-09-04 (×2): 10 mg via INTRAVENOUS

## 2021-09-04 MED ORDER — ONDANSETRON HCL 4 MG/2ML IJ SOLN
INTRAMUSCULAR | Status: DC | PRN
  Administered 2021-09-04: 4 mg via INTRAVENOUS

## 2021-09-04 MED ORDER — TRANEXAMIC ACID-NACL 1000-0.7 MG/100ML-% IV SOLN
1000.0000 mg | INTRAVENOUS | Status: AC
  Administered 2021-09-04: 1000 mg via INTRAVENOUS
  Filled 2021-09-04: qty 100

## 2021-09-04 MED ORDER — LIDOCAINE 2% (20 MG/ML) 5 ML SYRINGE
INTRAMUSCULAR | Status: DC | PRN
  Administered 2021-09-04: 50 mg via INTRAVENOUS

## 2021-09-04 MED ORDER — CHLORHEXIDINE GLUCONATE 0.12 % MT SOLN
15.0000 mL | Freq: Once | OROMUCOSAL | Status: AC
  Administered 2021-09-04: 15 mL via OROMUCOSAL

## 2021-09-04 MED ORDER — PROPOFOL 500 MG/50ML IV EMUL
INTRAVENOUS | Status: DC | PRN
  Administered 2021-09-04: 100 ug/kg/min via INTRAVENOUS

## 2021-09-04 MED ORDER — MIDAZOLAM HCL 2 MG/2ML IJ SOLN
INTRAMUSCULAR | Status: DC | PRN
  Administered 2021-09-04: 2 mg via INTRAVENOUS

## 2021-09-04 MED ORDER — LACTATED RINGERS IV SOLN: INTRAVENOUS | Status: DC

## 2021-09-04 MED ORDER — FENTANYL CITRATE (PF) 100 MCG/2ML IJ SOLN
INTRAMUSCULAR | Status: AC
  Filled 2021-09-04: qty 2

## 2021-09-04 MED ORDER — BUPIVACAINE IN DEXTROSE 0.75-8.25 % IT SOLN
INTRATHECAL | Status: DC | PRN
  Administered 2021-09-04: 2 mL via INTRATHECAL

## 2021-09-04 MED ORDER — KETOROLAC TROMETHAMINE 30 MG/ML IJ SOLN
INTRAMUSCULAR | Status: DC | PRN
  Administered 2021-09-04: 30 mg via INTRAVENOUS

## 2021-09-04 MED ORDER — SENNA 8.6 MG PO TABS: 2.0000 | ORAL_TABLET | Freq: Every day | ORAL | 0 refills | Status: AC

## 2021-09-04 MED ORDER — BUPIVACAINE-EPINEPHRINE 0.25% -1:200000 IJ SOLN
INTRAMUSCULAR | Status: DC | PRN
  Administered 2021-09-04: 30 mL

## 2021-09-04 MED ORDER — LACTATED RINGERS IV BOLUS
500.0000 mL | Freq: Once | INTRAVENOUS | Status: AC
Start: 2021-09-04 — End: 2021-09-04
  Administered 2021-09-04: 500 mL via INTRAVENOUS

## 2021-09-04 MED ORDER — CEFAZOLIN SODIUM-DEXTROSE 2-4 GM/100ML-% IV SOLN: 2.0000 g | Freq: Four times a day (QID) | INTRAVENOUS | Status: DC

## 2021-09-04 MED ORDER — CEFAZOLIN SODIUM-DEXTROSE 2-4 GM/100ML-% IV SOLN
2.0000 g | INTRAVENOUS | Status: AC
  Administered 2021-09-04: 2 g via INTRAVENOUS
  Filled 2021-09-04: qty 100

## 2021-09-04 MED ORDER — SODIUM CHLORIDE 0.9 % IR SOLN
Status: DC | PRN
  Administered 2021-09-04: 1000 mL

## 2021-09-04 SURGICAL SUPPLY — 58 items
BAG COUNTER SPONGE SURGICOUNT (BAG) IMPLANT
BAG DECANTER FOR FLEXI CONT (MISCELLANEOUS) IMPLANT
BAG ZIPLOCK 12X15 (MISCELLANEOUS) IMPLANT
CHLORAPREP W/TINT 26 (MISCELLANEOUS) ×2 IMPLANT
COVER PERINEAL POST (MISCELLANEOUS) ×2 IMPLANT
COVER SURGICAL LIGHT HANDLE (MISCELLANEOUS) ×2 IMPLANT
DERMABOND ADVANCED (GAUZE/BANDAGES/DRESSINGS) ×1
DERMABOND ADVANCED .7 DNX12 (GAUZE/BANDAGES/DRESSINGS) ×2 IMPLANT
DRAPE IMP U-DRAPE 54X76 (DRAPES) ×2 IMPLANT
DRAPE SHEET LG 3/4 BI-LAMINATE (DRAPES) ×6 IMPLANT
DRAPE STERI IOBAN 125X83 (DRAPES) ×2 IMPLANT
DRAPE U-SHAPE 47X51 STRL (DRAPES) ×4 IMPLANT
DRSG AQUACEL AG ADV 3.5X10 (GAUZE/BANDAGES/DRESSINGS) ×2 IMPLANT
ELECT REM PT RETURN 15FT ADLT (MISCELLANEOUS) ×2 IMPLANT
GAUZE SPONGE 4X4 12PLY STRL (GAUZE/BANDAGES/DRESSINGS) ×2 IMPLANT
GLOVE BIO SURGEON STRL SZ8.5 (GLOVE) ×4 IMPLANT
GLOVE BIOGEL M 7.0 STRL (GLOVE) ×2 IMPLANT
GLOVE BIOGEL PI IND STRL 7.5 (GLOVE) ×1 IMPLANT
GLOVE BIOGEL PI IND STRL 8.5 (GLOVE) ×1 IMPLANT
GLOVE BIOGEL PI INDICATOR 7.5 (GLOVE) ×1
GLOVE BIOGEL PI INDICATOR 8.5 (GLOVE) ×1
GLOVE SURG LX 7.5 STRW (GLOVE) ×2
GLOVE SURG LX STRL 7.5 STRW (GLOVE) ×2 IMPLANT
GOWN SPEC L3 XXLG W/TWL (GOWN DISPOSABLE) ×2 IMPLANT
GOWN STRL REUS W/ TWL XL LVL3 (GOWN DISPOSABLE) ×1 IMPLANT
GOWN STRL REUS W/TWL XL LVL3 (GOWN DISPOSABLE) ×1
HANDPIECE INTERPULSE COAX TIP (DISPOSABLE) ×1
HEAD CERAMIC BIOLOX 36 (Head) ×1 IMPLANT
HOLDER FOLEY CATH W/STRAP (MISCELLANEOUS) ×2 IMPLANT
HOOD PEEL AWAY FLYTE STAYCOOL (MISCELLANEOUS) ×6 IMPLANT
KIT TURNOVER KIT A (KITS) IMPLANT
LINER ACETAB NN G7 F 36 (Liner) ×1 IMPLANT
MANIFOLD NEPTUNE II (INSTRUMENTS) ×2 IMPLANT
MARKER SKIN DUAL TIP RULER LAB (MISCELLANEOUS) ×2 IMPLANT
NDL SAFETY ECLIPSE 18X1.5 (NEEDLE) ×1 IMPLANT
NDL SPNL 18GX3.5 QUINCKE PK (NEEDLE) ×1 IMPLANT
NEEDLE HYPO 18GX1.5 SHARP (NEEDLE) ×1
NEEDLE SPNL 18GX3.5 QUINCKE PK (NEEDLE) ×2 IMPLANT
PACK ANTERIOR HIP CUSTOM (KITS) ×2 IMPLANT
PENCIL SMOKE EVACUATOR (MISCELLANEOUS) ×1 IMPLANT
SAW OSC TIP CART 19.5X105X1.3 (SAW) ×2 IMPLANT
SEALER BIPOLAR AQUA 6.0 (INSTRUMENTS) ×2 IMPLANT
SET HNDPC FAN SPRY TIP SCT (DISPOSABLE) ×1 IMPLANT
SHELL ACET G7 4H 56 SZF (Shell) ×1 IMPLANT
SOLUTION PRONTOSAN WOUND 350ML (IRRIGATION / IRRIGATOR) ×2 IMPLANT
SPIKE FLUID TRANSFER (MISCELLANEOUS) ×2 IMPLANT
STEM FEMORAL TAPERLOC 13X111 (Stem) ×1 IMPLANT
SUT MNCRL AB 3-0 PS2 18 (SUTURE) ×2 IMPLANT
SUT MON AB 2-0 CT1 36 (SUTURE) ×3 IMPLANT
SUT STRATAFIX PDO 1 14 VIOLET (SUTURE) ×1
SUT STRATFX PDO 1 14 VIOLET (SUTURE) ×1
SUT VIC AB 2-0 CT1 27 (SUTURE) ×1
SUT VIC AB 2-0 CT1 TAPERPNT 27 (SUTURE) IMPLANT
SUTURE STRATFX PDO 1 14 VIOLET (SUTURE) ×1 IMPLANT
SYR 3ML LL SCALE MARK (SYRINGE) ×2 IMPLANT
TRAY FOLEY MTR SLVR 16FR STAT (SET/KITS/TRAYS/PACK) IMPLANT
TUBE SUCTION HIGH CAP CLEAR NV (SUCTIONS) ×2 IMPLANT
WATER STERILE IRR 1000ML POUR (IV SOLUTION) ×2 IMPLANT

## 2021-09-04 NOTE — Anesthesia Postprocedure Evaluation (Signed)
Anesthesia Post Note  Patient: Johnell Bas  Procedure(s) Performed: TOTAL HIP ARTHROPLASTY ANTERIOR APPROACH (Right: Hip)     Patient location during evaluation: PACU Anesthesia Type: Spinal Level of consciousness: awake, awake and alert and oriented Pain management: pain level controlled Vital Signs Assessment: post-procedure vital signs reviewed and stable Respiratory status: spontaneous breathing, nonlabored ventilation and respiratory function stable Cardiovascular status: blood pressure returned to baseline and stable Postop Assessment: no headache, no backache, spinal receding and no apparent nausea or vomiting Anesthetic complications: no   No notable events documented.  Last Vitals:  Vitals:   09/04/21 1115 09/04/21 1200  BP: 135/86 (!) 156/128  Pulse: 99 94  Resp: 18 18  Temp: 36.6 C   SpO2: 99% 100%    Last Pain:  Vitals:   09/04/21 1200  TempSrc:   PainSc: 3                  Collene Schlichter

## 2021-09-04 NOTE — Discharge Instructions (Signed)
? ?Dr. Martine Trageser ?Joint Replacement Specialist ?[facility] ?[address] ?[place], [place] [address] ?[phone] ? ? ?TOTAL HIP REPLACEMENT POSTOPERATIVE DIRECTIONS ? ? ? ?Hip Rehabilitation, Guidelines Following Surgery  ? ?WEIGHT BEARING ?Weight bearing as tolerated with assist device (walker, cane, etc) as directed, use it as long as suggested by your surgeon or therapist, typically at least 4-6 weeks. ? ?The results of a hip operation are greatly improved after range of motion and muscle strengthening exercises. Follow all safety measures which are given to protect your hip. If any of these exercises cause increased pain or swelling in your joint, decrease the amount until you are comfortable again. Then slowly increase the exercises. Call your caregiver if you have problems or questions.  ? ?HOME CARE INSTRUCTIONS  ?Most of the following instructions are designed to prevent the dislocation of your new hip.  ?Remove items at home which could result in a fall. This includes throw rugs or furniture in walking pathways.  ?Continue medications as instructed at time of discharge. ?You may have some home medications which will be placed on hold until you complete the course of blood thinner medication. ?You may start showering once you are discharged home. Do not remove your dressing. ?Do not put on socks or shoes without following the instructions of your caregivers.   ?Sit on chairs with arms. Use the chair arms to help push yourself up when arising.  ?Arrange for the use of a toilet seat elevator so you are not sitting low.  ?Walk with walker as instructed.  ?You may resume a sexual relationship in one month or when given the OK by your caregiver.  ?Use walker as long as suggested by your caregivers.  ?You may put full weight on your legs and walk as much as is comfortable. ?Avoid periods of inactivity such as sitting longer than an hour when not asleep. This helps prevent blood  clots.  ?You may return to work once you are cleared by your surgeon.  ?Do not drive a car for 6 weeks or until released by your surgeon.  ?Do not drive while taking narcotics.  ?Wear elastic stockings for two weeks following surgery during the day but you may remove then at night.  ?Make sure you keep all of your appointments after your operation with all of your doctors and caregivers. You should call the office at the above phone number and make an appointment for approximately two weeks after the date of your surgery. ?Please pick up a stool softener and laxative for home use as long as you are requiring pain medications. ?ICE to the affected hip every three hours for 30 minutes at a time and then as needed for pain and swelling. Continue to use ice on the hip for pain and swelling from surgery. You may notice swelling that will progress down to the foot and ankle.  This is normal after surgery.  Elevate the leg when you are not up walking on it.   ?It is important for you to complete the blood thinner medication as prescribed by your doctor. ?Continue to use the breathing machine which will help keep your temperature down.  It is common for your temperature to cycle up and down following surgery, especially at night when you are not up moving around and exerting yourself.  The breathing machine keeps your lungs expanded and your temperature down. ? ?RANGE OF MOTION AND STRENGTHENING EXERCISES  ?These exercises are designed to help you   keep full movement of your hip joint. Follow your caregiver's or physical therapist's instructions. Perform all exercises about fifteen times, three times per day or as directed. Exercise both hips, even if you have had only one joint replacement. These exercises can be done on a training (exercise) mat, on the floor, on a table or on a bed. Use whatever works the best and is most comfortable for you. Use music or television while you are exercising so that the exercises are a  pleasant break in your day. This will make your life better with the exercises acting as a break in routine you can look forward to.  ?Lying on your back, slowly slide your foot toward your buttocks, raising your knee up off the floor. Then slowly slide your foot back down until your leg is straight again.  ?Lying on your back spread your legs as far apart as you can without causing discomfort.  ?Lying on your side, raise your upper leg and foot straight up from the floor as far as is comfortable. Slowly lower the leg and repeat.  ?Lying on your back, tighten up the muscle in the front of your thigh (quadriceps muscles). You can do this by keeping your leg straight and trying to raise your heel off the floor. This helps strengthen the largest muscle supporting your knee.  ?Lying on your back, tighten up the muscles of your buttocks both with the legs straight and with the knee bent at a comfortable angle while keeping your heel on the floor.  ? ?SKILLED REHAB INSTRUCTIONS: ?If the patient is transferred to a skilled rehab facility following release from the hospital, a list of the current medications will be sent to the facility for the patient to continue.  When discharged from the skilled rehab facility, please have the facility set up the patient's Home Health Physical Therapy prior to being released. Also, the skilled facility will be responsible for providing the patient with their medications at time of release from the facility to include their pain medication and their blood thinner medication. If the patient is still at the rehab facility at time of the two week follow up appointment, the skilled rehab facility will also need to assist the patient in arranging follow up appointment in our office and any transportation needs. ? ?POST-OPERATIVE OPIOID TAPER INSTRUCTIONS: ?It is important to wean off of your opioid medication as soon as possible. If you do not need pain medication after your surgery it is ok  to stop day one. ?Opioids include: ?Codeine, Hydrocodone(Norco, Vicodin), Oxycodone(Percocet, oxycontin) and hydromorphone amongst others.  ?Long term and even short term use of opiods can cause: ?Increased pain response ?Dependence ?Constipation ?Depression ?Respiratory depression ?And more.  ?Withdrawal symptoms can include ?Flu like symptoms ?Nausea, vomiting ?And more ?Techniques to manage these symptoms ?Hydrate well ?Eat regular healthy meals ?Stay active ?Use relaxation techniques(deep breathing, meditating, yoga) ?Do Not substitute Alcohol to help with tapering ?If you have been on opioids for less than two weeks and do not have pain than it is ok to stop all together.  ?Plan to wean off of opioids ?This plan should start within one week post op of your joint replacement. ?Maintain the same interval or time between taking each dose and first decrease the dose.  ?Cut the total daily intake of opioids by one tablet each day ?Next start to increase the time between doses. ?The last dose that should be eliminated is the evening dose.  ? ? ?MAKE   SURE YOU:  ?Understand these instructions.  ?Will watch your condition.  ?Will get help right away if you are not doing well or get worse. ? ?Pick up stool softner and laxative for home use following surgery while on pain medications. ?Do not remove your dressing. ?The dressing is waterproof--it is OK to take showers. ?Continue to use ice for pain and swelling after surgery. ?Do not use any lotions or creams on the incision until instructed by your surgeon. ?Total Hip Protocol. ? ?

## 2021-09-04 NOTE — Transfer of Care (Signed)
Immediate Anesthesia Transfer of Care Note  Patient: Stevenson Windmiller  Procedure(s) Performed: TOTAL HIP ARTHROPLASTY ANTERIOR APPROACH (Right: Hip)  Patient Location: PACU  Anesthesia Type:MAC and Spinal  Level of Consciousness: awake, alert  and oriented  Airway & Oxygen Therapy: Patient Spontanous Breathing  Post-op Assessment: Report given to RN and Post -op Vital signs reviewed and stable  Post vital signs: Reviewed and stable  Last Vitals:  Vitals Value Taken Time  BP 108/62 09/04/21 0949  Temp    Pulse 103 09/04/21 0952  Resp 16 09/04/21 0952  SpO2 98 % 09/04/21 0952  Vitals shown include unvalidated device data.  Last Pain:  Vitals:   09/04/21 0556  TempSrc: Oral  PainSc:          Complications: No notable events documented.

## 2021-09-04 NOTE — Op Note (Signed)
OPERATIVE REPORT  SURGEON: Rod Can, MD   ASSISTANT: Larene Pickett, PA-C.  PREOPERATIVE DIAGNOSIS: Right hip arthritis.   POSTOPERATIVE DIAGNOSIS: Right hip arthritis.   PROCEDURE: Right total hip arthroplasty, anterior approach.   IMPLANTS: Biomet Taperloc Complete Microplasty stem, size 13 x 111 mm, high offset. Biomet G7 OsseoTi Cup, size 56 mm. Biomet Vivacit-E liner, size 36 mm, F, neutral. Biomet Biolox ceramic head ball, size 36 - 3 mm.  ANESTHESIA:  MAC and Spinal  ESTIMATED BLOOD LOSS:-300 mL    ANTIBIOTICS: 2 g Ancef.  DRAINS: None.  COMPLICATIONS: None.   CONDITION: PACU - hemodynamically stable.   BRIEF CLINICAL NOTE: Ricardo Heath is a 51 y.o. male with a long-standing history of Right hip arthritis. After failing conservative management, the patient was indicated for total hip arthroplasty. The risks, benefits, and alternatives to the procedure were explained, and the patient elected to proceed.  PROCEDURE IN DETAIL: Surgical site was marked by myself in the pre-op holding area. Once inside the operating room, spinal anesthesia was obtained, and a foley catheter was inserted. The patient was then positioned on the Hana table.  All bony prominences were well padded.  The hip was prepped and draped in the normal sterile surgical fashion.  A time-out was called verifying side and site of surgery. The patient received IV antibiotics within 60 minutes of beginning the procedure.   Bikini incision was made, and superficial dissection was performed lateral to the ASIS. The direct anterior approach to the hip was performed through the Hueter interval.  Lateral femoral circumflex vessels were treated with the Auqumantys. The anterior capsule was exposed and an inverted T capsulotomy was made. The femoral neck cut was made to the level of the templated cut.  A corkscrew was placed into the head and the head was removed.  The femoral head was found to have eburnated  bone. The head was passed to the back table and was measured. Pubofemoral ligament was released off of the calcar, taking care to stay on bone. Superior capsule was released from the greater trochanter, taking care to stay lateral to the posterior border of the femoral neck in order to preserve the short external rotators.   Acetabular exposure was achieved, and the pulvinar and labrum were excised. Sequential reaming of the acetabulum was then performed up to a size 55 mm reamer. A 56 mm cup was then opened and impacted into place at approximately 40 degrees of abduction and 20 degrees of anteversion. The final polyethylene liner was impacted into place and acetabular osteophytes were removed.    I then gained femoral exposure taking care to protect the abductors and greater trochanter.  This was performed using standard external rotation, extension, and adduction.  A cookie cutter was used to enter the femoral canal, and then the femoral canal finder was placed.  Sequential broaching was performed up to a size 13.  Calcar planer was used on the femoral neck remnant.  I placed a high offset neck and a trial head ball.  The hip was reduced.  Leg lengths and offset were checked fluoroscopically.  The hip was dislocated and trial components were removed.  The final implants were placed, and the hip was reduced.  Fluoroscopy was used to confirm component position and leg lengths.  At 90 degrees of external rotation and full extension, the hip was stable to an anterior directed force.   The wound was copiously irrigated with Prontosan solution and normal saline using pule lavage.  Marcaine solution was injected into the periarticular soft tissue.  The wound was closed in layers using #1 Vicryl and V-Loc for the fascia, 2-0 Vicryl for the subcutaneous fat, 2-0 Monocryl for the deep dermal layer, 3-0 running Monocryl subcuticular stitch, and Dermabond for the skin.  Once the glue was fully dried, an Aquacell Ag  dressing was applied.  The patient was transported to the recovery room in stable condition.  Sponge, needle, and instrument counts were correct at the end of the case x2.  The patient tolerated the procedure well and there were no known complications.  Please note that a surgical assistant was a medical necessity for this procedure to perform it in a safe and expeditious manner. Assistant was necessary to provide appropriate retraction of vital neurovascular structures, to prevent femoral fracture, and to allow for anatomic placement of the prosthesis.

## 2021-09-04 NOTE — Interval H&P Note (Signed)
History and Physical Interval Note:  09/04/2021 7:30 AM  Ricardo Heath  has presented today for surgery, with the diagnosis of Right hip osteoarthrtitis.  The various methods of treatment have been discussed with the patient and family. After consideration of risks, benefits and other options for treatment, the patient has consented to  Procedure(s) with comments: TOTAL HIP ARTHROPLASTY ANTERIOR APPROACH (Right) - 150 as a surgical intervention.  The patient's history has been reviewed, patient examined, no change in status, stable for surgery.  I have reviewed the patient's chart and labs.  Questions were answered to the patient's satisfaction.     Iline Oven Scarlette Hogston

## 2021-09-04 NOTE — Evaluation (Signed)
Physical Therapy Evaluation Patient Details Name: Ricardo Heath MRN: [id number] DOB: [birth year] Today's Date: 09/04/2021  History of Present Illness  Pt is a 51yo male presenting s/p R-THAAA on 09/04/21. PMH: hx of DVT, GERD, gout, HTN, PVD, OSA on CPAP, lumbar L4-L5 laminectomy, Anterior cervical decompression C4 through C7.   Clinical Impression  Ricardo Heath is a 51 y.o. male POD 0 s/p R-THAAA. Patient reports independence with mobility at baseline. Patient is now limited by functional impairments (see PT problem list below) and requires min guard for transfers and gait with RW. Patient was able to ambulate 100 feet with RW and min guard and cues for safe walker management. Patient educated on safe sequencing for stair mobility and verbalized safe guarding position for people assisting with mobility. Patient instructed in exercises to facilitate ROM and circulation. Patient will benefit from continued skilled PT interventions to address impairments and progress towards PLOF. Patient has met mobility goals at adequate level for discharge home; will continue to follow if pt continues acute stay to progress towards Mod I goals.        Recommendations for follow up therapy are one component of a multi-disciplinary discharge planning process, led by the attending physician.  Recommendations may be updated based on patient status, additional functional criteria and insurance authorization.  Follow Up Recommendations Follow physician's recommendations for discharge plan and follow up therapies      Assistance Recommended at Discharge Set up Supervision/Assistance  Patient can return home with the following  A little help with walking and/or transfers;A little help with bathing/dressing/bathroom;Assistance with cooking/housework;Assist for transportation;Help with stairs or ramp for entrance    Equipment Recommendations Rolling walker (2 wheels)  Recommendations for Other Services        Functional Status Assessment Patient has had a recent decline in their functional status and demonstrates the ability to make significant improvements in function in a reasonable and predictable amount of time.     Precautions / Restrictions Precautions Precautions: Fall Restrictions Weight Bearing Restrictions: No Other Position/Activity Restrictions: WBAT      Mobility  Bed Mobility Overal bed mobility: Independent             General bed mobility comments: Pt sitting on EOB upon entry wearing his crocks, reporting he had just finished voiding. Reinforced importance of not mobilizing with staff not present, pt verbalized understanding.    Transfers Overall transfer level: Needs assistance Equipment used: Rolling walker (2 wheels) Transfers: Sit to/from Stand Sit to Stand: Min guard           General transfer comment: for safety only, no physical assist required.    Ambulation/Gait Ambulation/Gait assistance: Min guard, Supervision Gait Distance (Feet): 100 Feet Assistive device: Rolling walker (2 wheels) Gait Pattern/deviations: Step-to pattern Gait velocity: decreased     General Gait Details: Pt ambulated 151ft with RW and min guard assist progressed to supervision, no overt LOB noted. VCs for proximity to device.  Stairs Stairs: Yes Stairs assistance: Min guard Stair Management: Two rails, Step to pattern, Forwards Number of Stairs: 2 General stair comments: Pt verbalized understanding of safe stair mobility using bilateral rails. Demonstated safe technique with VCs. No overt LOB noted.  Wheelchair Mobility    Modified Rankin (Stroke Patients Only)       Balance Overall balance assessment: Needs assistance Sitting-balance support: Feet supported, No upper extremity supported Sitting balance-Leahy Scale: Good     Standing balance support: Reliant on assistive device for balance, During functional activity, Bilateral upper  extremity  supported Standing balance-Leahy Scale: Poor                               Pertinent Vitals/Pain Pain Assessment Pain Assessment: 0-10 Pain Score: 3  Pain Location: R Hip Pain Descriptors / Indicators: Operative site guarding Pain Intervention(s): Limited activity within patient's tolerance, Monitored during session, Repositioned, Ice applied    Home Living Family/patient expects to be discharged to:: Private residence Living Arrangements: Spouse/significant other Available Help at Discharge: Family;Available 24 hours/day Type of Home: House Home Access: Stairs to enter Entrance Stairs-Rails: Can reach both;Left;Right Entrance Stairs-Number of Steps: 4   Home Layout: One level Home Equipment: None      Prior Function Prior Level of Function : Independent/Modified Independent;Driving;Working/employed             Mobility Comments: IND ADLs Comments: IND     Hand Dominance        Extremity/Trunk Assessment   Upper Extremity Assessment Upper Extremity Assessment: Overall WFL for tasks assessed    Lower Extremity Assessment Lower Extremity Assessment: RLE deficits/detail;LLE deficits/detail RLE Deficits / Details: MMT ank DF/PF 5/5 RLE Sensation: WNL LLE Deficits / Details: MMT ank DF/PF 5/5 LLE Sensation: WNL    Cervical / Trunk Assessment Cervical / Trunk Assessment: Back Surgery;Neck Surgery  Communication   Communication: No difficulties  Cognition Arousal/Alertness: Awake/alert Behavior During Therapy: WFL for tasks assessed/performed Overall Cognitive Status: Within Functional Limits for tasks assessed                                          General Comments      Exercises Total Joint Exercises Ankle Circles/Pumps: AROM, Both, 10 reps Quad Sets: AROM, Right, 5 reps Short Arc Quad: AROM, Right, 5 reps Heel Slides: AROM, Right, 5 reps Hip ABduction/ADduction: AROM, Right, 5 reps   Assessment/Plan    PT  Assessment Patient needs continued PT services  PT Problem List Decreased strength;Decreased range of motion;Decreased activity tolerance;Decreased balance;Decreased mobility;Decreased coordination;Decreased knowledge of use of DME;Pain       PT Treatment Interventions DME instruction;Gait training;Stair training;Functional mobility training;Therapeutic activities;Therapeutic exercise;Balance training;Neuromuscular re-education;Patient/family education    PT Goals (Current goals can be found in the Care Plan section)  Acute Rehab PT Goals Patient Stated Goal: to go back to work PT Goal Formulation: With patient Time For Goal Achievement: 09/11/21 Potential to Achieve Goals: Good    Frequency 7X/week     Co-evaluation               AM-PAC PT "6 Clicks" Mobility  Outcome Measure Help needed turning from your back to your side while in a flat bed without using bedrails?: None Help needed moving from lying on your back to sitting on the side of a flat bed without using bedrails?: None Help needed moving to and from a bed to a chair (including a wheelchair)?: A Little Help needed standing up from a chair using your arms (e.g., wheelchair or bedside chair)?: A Little Help needed to walk in hospital room?: A Little Help needed climbing 3-5 steps with a railing? : A Little 6 Click Score: 20    End of Session Equipment Utilized During Treatment: Gait belt Activity Tolerance: Patient tolerated treatment well;No increased pain Patient left: in chair;with call bell/phone within reach Nurse Communication: Mobility status PT Visit  Diagnosis: Pain;Difficulty in walking, not elsewhere classified (R26.2) Pain - Right/Left: Right Pain - part of body: Hip    Time: 1310-1336 PT Time Calculation (min) (ACUTE ONLY): 26 min   Charges:   PT Evaluation $PT Eval Low Complexity: 1 Low PT Treatments $Therapeutic Exercise: 8-22 mins        Coolidge Breeze, PT, DPT [facility]  Department Office: [phone] Pager: [phone]  Coolidge Breeze 09/04/2021, 3:13 PM

## 2021-09-04 NOTE — Anesthesia Procedure Notes (Signed)
Spinal  Patient location during procedure: OR Start time: 09/04/2021 7:43 AM End time: 09/04/2021 7:49 AM Reason for block: surgical anesthesia Staffing Performed: anesthesiologist  Anesthesiologist: Collene Schlichter, MD Performed by: Collene Schlichter, MD Authorized by: Collene Schlichter, MD   Preanesthetic Checklist Completed: patient identified, IV checked, risks and benefits discussed, surgical consent, monitors and equipment checked, pre-op evaluation and timeout performed Spinal Block Patient position: sitting Prep: DuraPrep and site prepped and draped Patient monitoring: continuous pulse ox and blood pressure Approach: midline Location: L3-4 Injection technique: single-shot Needle Needle type: Pencan  Needle gauge: 24 G Assessment Events: CSF return Additional Notes Functioning IV was confirmed and monitors were applied. Sterile prep and drape, including hand hygiene, mask and sterile gloves were used. The patient was positioned and the spine was prepped. The skin was anesthetized with lidocaine.  Free flow of clear CSF was obtained prior to injecting local anesthetic into the CSF.  The spinal needle aspirated freely following injection.  The needle was carefully withdrawn.  The patient tolerated the procedure well. Consent was obtained prior to procedure with all questions answered and concerns addressed. Risks including but not limited to bleeding, infection, nerve damage, paralysis, failed block, inadequate analgesia, allergic reaction, high spinal, itching and headache were discussed and the patient wished to proceed.   Arrie Aran, MD

## 2021-09-08 ENCOUNTER — Encounter (HOSPITAL_COMMUNITY): Payer: Self-pay | Admitting: Orthopedic Surgery

## 2021-11-19 ENCOUNTER — Other Ambulatory Visit (HOSPITAL_BASED_OUTPATIENT_CLINIC_OR_DEPARTMENT_OTHER): Payer: Self-pay

## 2021-11-20 ENCOUNTER — Other Ambulatory Visit (HOSPITAL_BASED_OUTPATIENT_CLINIC_OR_DEPARTMENT_OTHER): Payer: Self-pay

## 2021-11-20 ENCOUNTER — Ambulatory Visit (HOSPITAL_COMMUNITY)
Admission: RE | Admit: 2021-11-20 | Discharge: 2021-11-20 | Disposition: A | Discharge status: Home / Self Care | Payer: BC Managed Care – PPO | Source: Ambulatory Visit | Attending: Cardiovascular Disease | Admitting: Cardiovascular Disease

## 2021-11-20 ENCOUNTER — Other Ambulatory Visit (HOSPITAL_COMMUNITY): Payer: Self-pay | Admitting: Family Medicine

## 2021-11-20 DIAGNOSIS — M79661 Pain in right lower leg: Secondary | ICD-10-CM | POA: Diagnosis present

## 2021-11-20 MED ORDER — HYDROCODONE-ACETAMINOPHEN 10-325 MG PO TABS
1.0000 | ORAL_TABLET | Freq: Three times a day (TID) | ORAL | 0 refills | Status: AC | PRN
  Filled 2021-11-20: qty 90, 30d supply, fill #0

## 2021-12-22 ENCOUNTER — Other Ambulatory Visit (HOSPITAL_BASED_OUTPATIENT_CLINIC_OR_DEPARTMENT_OTHER): Payer: Self-pay

## 2022-03-03 IMAGING — CT CT ANGIO CHEST-ABD-PELV FOR DISSECTION W/ AND WO/W CM
2 of 9 series · 15 of 46 positions shown, 17 images · non-contrast
Comparison: March 04, 2013

CLINICAL DATA: Abdominal pain.  Suspect aortic dissection.

EXAM:
CT ANGIOGRAPHY CHEST, ABDOMEN AND PELVIS
TECHNIQUE: Non-contrast CT of the chest was initially obtained.

[Series 4: axial arterial · axial · arterial · 0.96mm/px · z∈[+671,+1250]mm · 12 of 219 slices shown, 14 images]
[im 13/219  soft-tissue]
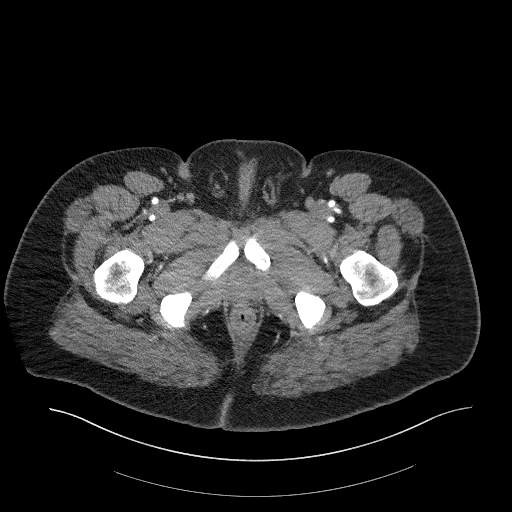
[im 13/219  bone]
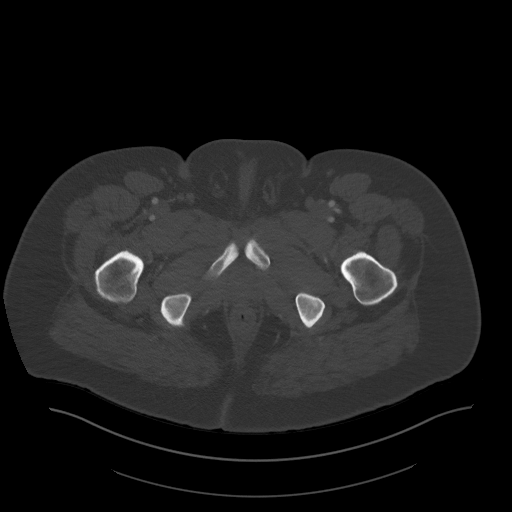
[im 37/219  soft-tissue]
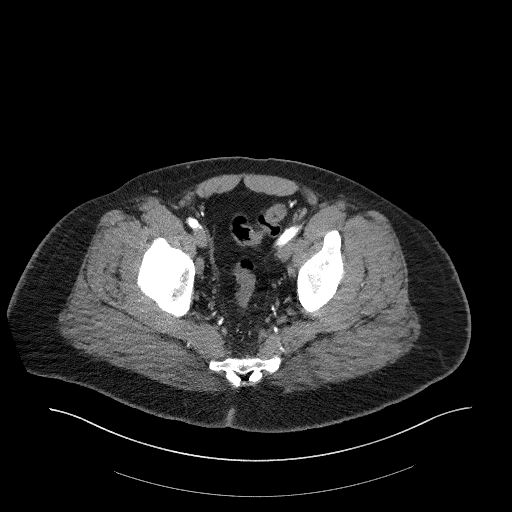
[im 49/219  soft-tissue]
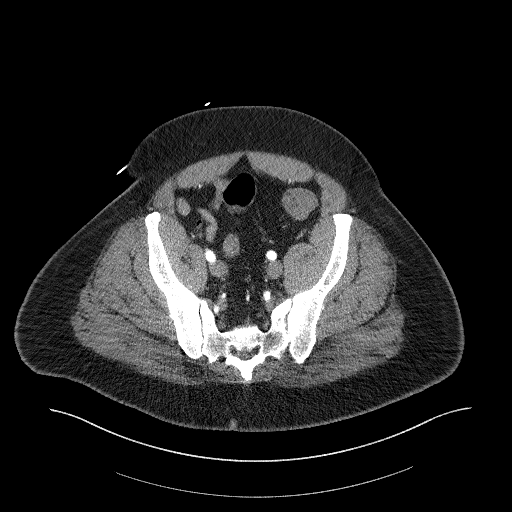
[im 61/219  soft-tissue]
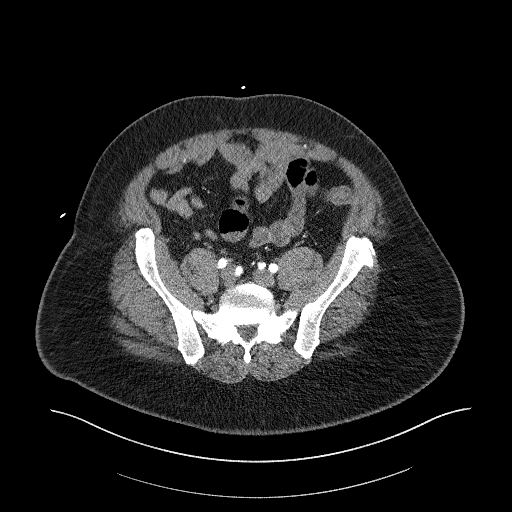
[im 85/219  soft-tissue]
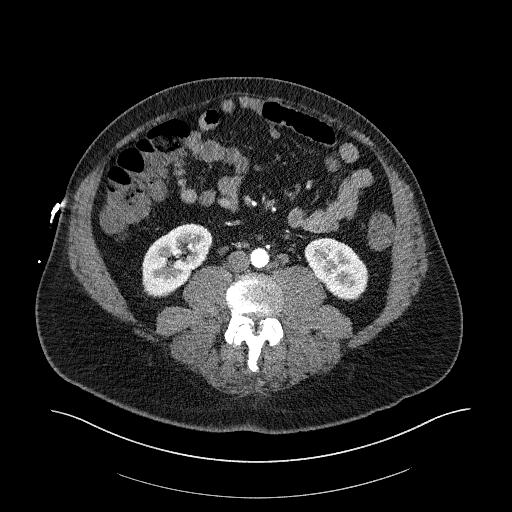
[im 97/219  soft-tissue]
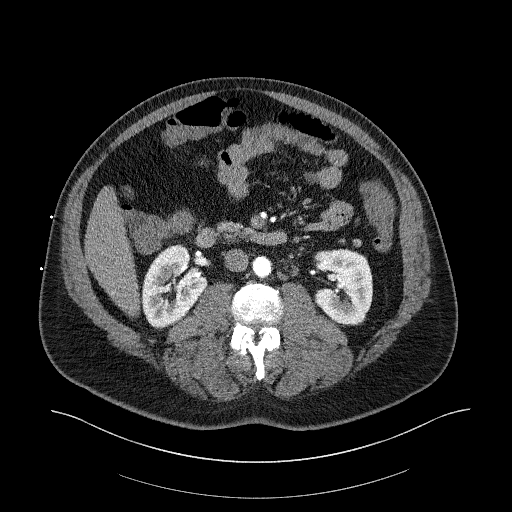
[im 122/219  soft-tissue]
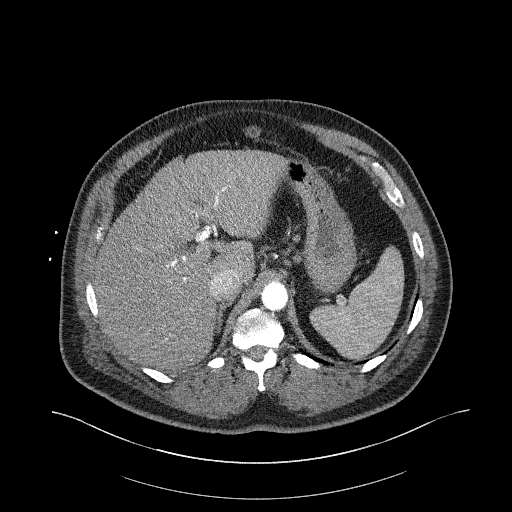
[im 134/219  soft-tissue]
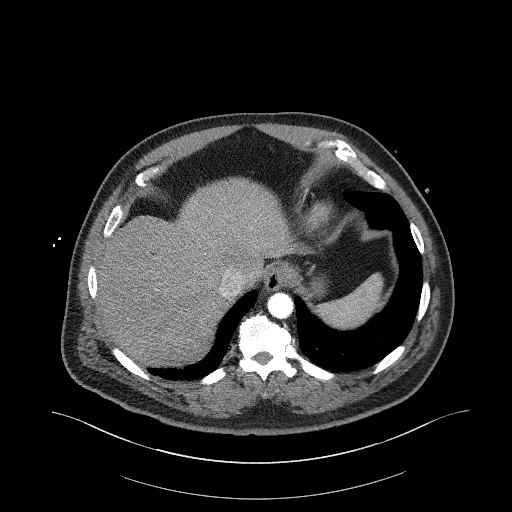
[im 158/219  soft-tissue]
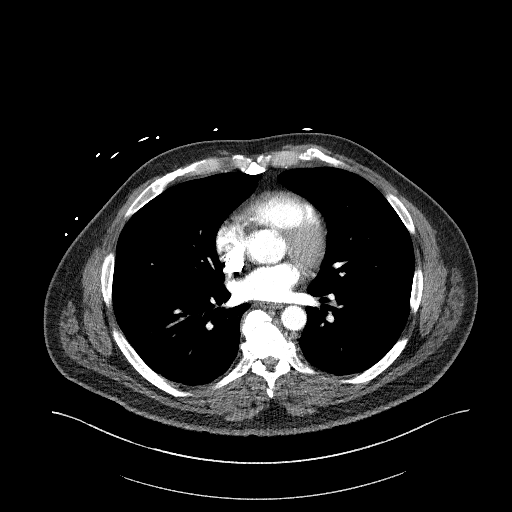
[im 158/219  bone]
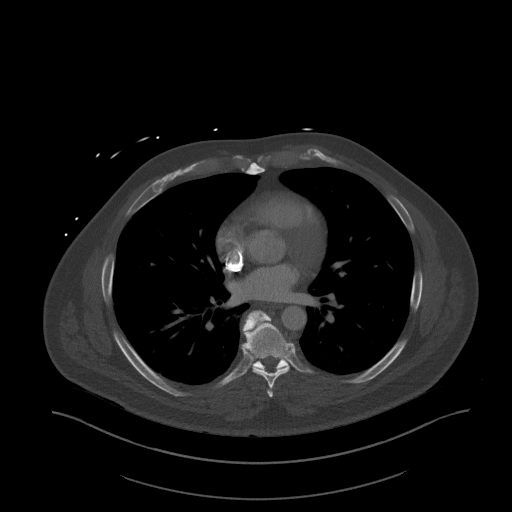
[im 170/219  soft-tissue]
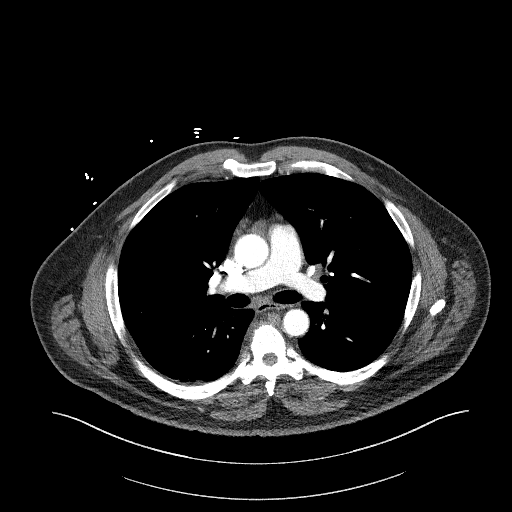
[im 182/219  soft-tissue]
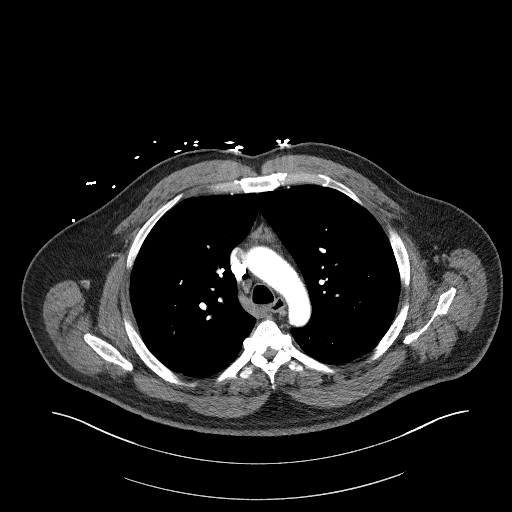
[im 206/219  soft-tissue]
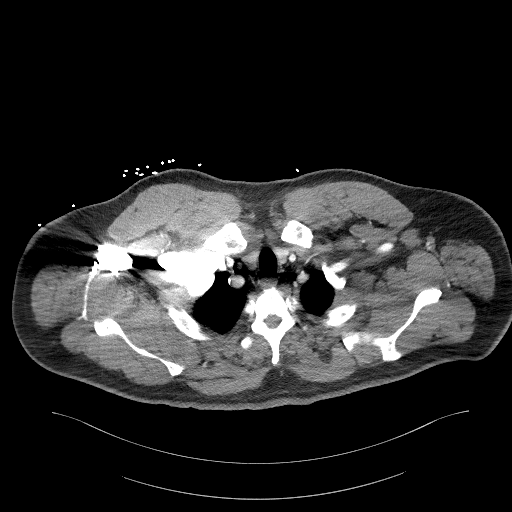

[Series 7: coronals · coronal · 1.07mm/px · 3 of 183 slices shown]
[im 46/183  soft-tissue]
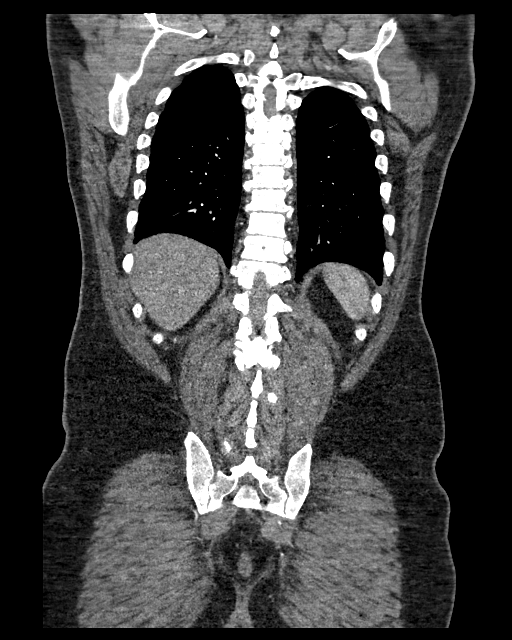
[im 92/183  soft-tissue]
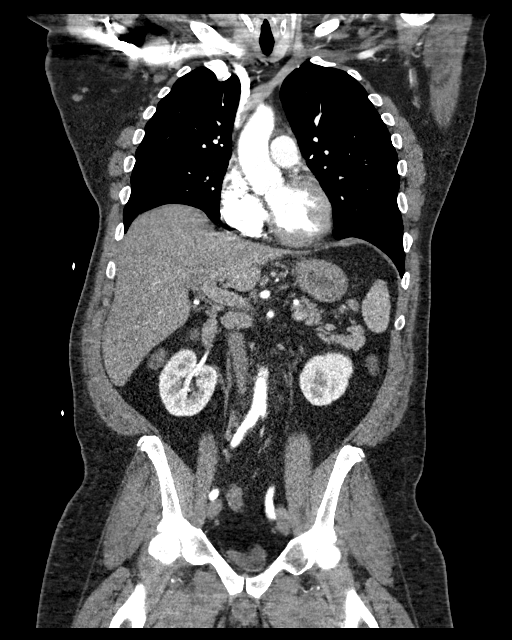
[im 137/183  soft-tissue]
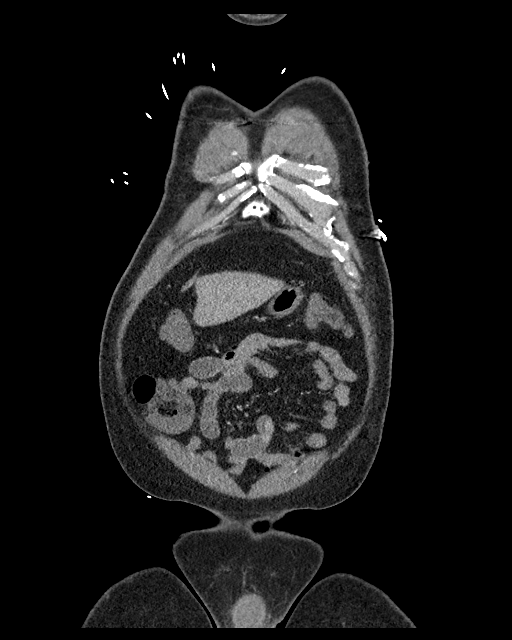

[15 of 46 positions shown; findings below may reference images not displayed]

Multidetector CT imaging through the chest, abdomen and pelvis was
performed using the standard protocol during bolus administration of
intravenous contrast. Multiplanar reconstructed images and MIPs were
obtained and reviewed to evaluate the vascular anatomy.

CONTRAST:  100mL OMNIPAQUE IOHEXOL 350 MG/ML SOLN
FINDINGS: CTA CHEST FINDINGS

Cardiovascular: Preferential opacification of the thoracic aorta. No
evidence of thoracic aortic aneurysm or dissection. Normal heart
size. No pericardial effusion.

Mediastinum/Nodes: No enlarged mediastinal, hilar, or axillary lymph
nodes. Thyroid gland, trachea, and esophagus demonstrate no
significant findings. Residual thymic tissue is identified
unchanged.

Lungs/Pleura: Lungs are clear. No pleural effusion or pneumothorax.
Minimal posterior dependent atelectasis of right lung base is noted.

Musculoskeletal: Degenerative joint changes of the spine are noted.

Review of the MIP images confirms the above findings.

CTA ABDOMEN AND PELVIS FINDINGS

VASCULAR

Aorta: Normal caliber aorta without aneurysm, dissection, vasculitis
or significant stenosis.

Celiac: Patent without evidence of aneurysm, dissection, vasculitis
or significant stenosis.

SMA: Patent without evidence of aneurysm, dissection, vasculitis or
significant stenosis.

Renals: Both renal arteries are patent without evidence of aneurysm,
dissection, vasculitis, fibromuscular dysplasia or significant
stenosis.

IMA: Patent without evidence of aneurysm, dissection, vasculitis or
significant stenosis.

Inflow: Patent without evidence of aneurysm, dissection, vasculitis
or significant stenosis.

Veins: No obvious venous abnormality within the limitations of this
arterial phase study.

Review of the MIP images confirms the above findings.

NON-VASCULAR

Hepatobiliary: Small focal enhancement is identified in the anterior
inferior liver unchanged compared to prior exam. No focal lesion is
identified in the liver. The gallbladder and biliary tree are
normal.

Pancreas: Unremarkable. No pancreatic ductal dilatation or
surrounding inflammatory changes.

Spleen: Normal in size without focal abnormality.

Adrenals/Urinary Tract: Adrenal glands are unremarkable. Kidneys are
normal, without renal calculi, focal lesion, or hydronephrosis.
Bladder is unremarkable.

Stomach/Bowel: Stomach is within normal limits. Appendix appears
normal. No evidence of bowel wall thickening, distention, or
inflammatory changes.

Lymphatic: Negative.

Reproductive: Prostate is unremarkable.

Other: None.  There is small umbilical herniation of mesenteric fat.

Musculoskeletal: No acute abnormality.

Review of the MIP images confirms the above findings.
IMPRESSION: 1. No evidence of aortic aneurysm or dissection.
2. No acute abnormality identified in the chest, abdomen, and
pelvis.

## 2022-06-08 ENCOUNTER — Encounter: Payer: Self-pay | Admitting: *Deleted

## 2023-02-03 ENCOUNTER — Emergency Department (HOSPITAL_BASED_OUTPATIENT_CLINIC_OR_DEPARTMENT_OTHER): Payer: BC Managed Care – PPO

## 2023-02-03 ENCOUNTER — Encounter (HOSPITAL_BASED_OUTPATIENT_CLINIC_OR_DEPARTMENT_OTHER): Payer: Self-pay | Admitting: Urology

## 2023-02-03 ENCOUNTER — Emergency Department (HOSPITAL_BASED_OUTPATIENT_CLINIC_OR_DEPARTMENT_OTHER)
Admission: EM | Admit: 2023-02-03 | Discharge: 2023-02-03 | Disposition: A | Discharge status: Home / Self Care | Payer: BC Managed Care – PPO | Attending: Emergency Medicine | Admitting: Emergency Medicine

## 2023-02-03 DIAGNOSIS — W228XXA Striking against or struck by other objects, initial encounter: Secondary | ICD-10-CM | POA: Diagnosis not present

## 2023-02-03 DIAGNOSIS — I10 Essential (primary) hypertension: Secondary | ICD-10-CM | POA: Diagnosis not present

## 2023-02-03 DIAGNOSIS — Z7901 Long term (current) use of anticoagulants: Secondary | ICD-10-CM | POA: Insufficient documentation

## 2023-02-03 DIAGNOSIS — S8990XA Unspecified injury of unspecified lower leg, initial encounter: Secondary | ICD-10-CM | POA: Insufficient documentation

## 2023-02-03 DIAGNOSIS — M25561 Pain in right knee: Secondary | ICD-10-CM | POA: Diagnosis present

## 2023-02-03 MED ORDER — ACETAMINOPHEN 325 MG PO TABS
650.0000 mg | ORAL_TABLET | Freq: Once | ORAL | Status: AC
Start: 2023-02-03 — End: 2023-02-03
  Administered 2023-02-03: 650 mg via ORAL
  Filled 2023-02-03: qty 2

## 2023-02-03 MED ORDER — DICLOFENAC SODIUM 1 % EX GEL: 2.0000 g | Freq: Four times a day (QID) | CUTANEOUS | 0 refills | Status: AC

## 2023-02-03 MED ORDER — HYDROMORPHONE HCL 1 MG/ML IJ SOLN
1.0000 mg | Freq: Once | INTRAMUSCULAR | Status: AC
  Administered 2023-02-03: 1 mg via INTRAMUSCULAR
  Filled 2023-02-03: qty 1

## 2023-02-03 MED ORDER — CYCLOBENZAPRINE HCL 10 MG PO TABS: 10.0000 mg | ORAL_TABLET | Freq: Two times a day (BID) | ORAL | 0 refills | Status: AC | PRN

## 2023-02-03 MED ORDER — KETOROLAC TROMETHAMINE 10 MG PO TABS
10.0000 mg | ORAL_TABLET | Freq: Once | ORAL | Status: AC
  Administered 2023-02-03: 10 mg via ORAL
  Filled 2023-02-03: qty 1

## 2023-02-03 MED ORDER — CYCLOBENZAPRINE HCL 5 MG PO TABS
5.0000 mg | ORAL_TABLET | Freq: Once | ORAL | Status: AC
Start: 2023-02-03 — End: 2023-02-03
  Administered 2023-02-03: 5 mg via ORAL
  Filled 2023-02-03: qty 1

## 2023-02-03 MED ORDER — ACETAMINOPHEN 325 MG PO TABS: 650.0000 mg | ORAL_TABLET | Freq: Four times a day (QID) | ORAL | 0 refills | Status: AC | PRN

## 2023-02-03 NOTE — Discharge Instructions (Addendum)
It was a pleasure caring for you today in the emergency department.  You likely have a sprain of a ligament in your knee.  Please use knee brace and crutches daily.  Continue taking your home narcotic pain medication.  Please follow-up with orthopedics if symptoms persist.  Consider using topical lidocaine patches that are available over-the-counter.  Do not take more than 4000 mg of Tylenol daily, please note that your hydrocodone contains 325 mg of Tylenol in each tablet.  Avoid NSAIDs such as Motrin or Aleve as they are contraindicated with your blood thinner Eliquis  Please return to the emergency department for any worsening or worrisome symptoms.

## 2023-02-03 NOTE — ED Provider Notes (Signed)
[place] [facility] Provider Note  CSN: [id number] Arrival date & time: 02/03/23 1901  Chief Complaint(s) Knee Pain  HPI Ricardo Heath is a 52 y.o. male with past medical history as below, significant for arthritis, gout, hypertension, PVD, OSA on CPAP who presents to the ED with complaint of right knee injury  Patient reports he hit his right knee on a bedpost on Saturday, since has been having difficulty with the discomfort, worsening pain to the lateral aspect of his right knee.  Difficulty walking at times.  He is taking his home oxycodone as prescribed with mild relief to his discomfort.  No numbness or tingling to his right leg, no pain to ankle or hip on the affected side.  No fevers, no significant swelling.  Difficulty walking secondary to right knee pain  Past Medical History Past Medical History:  Diagnosis Date   Arthritis    Complication of anesthesia    a little combative after back surgery   DVT (deep venous thrombosis) (HCC)    GERD (gastroesophageal reflux disease)    Gout    Hypertension    Peripheral vascular disease (HCC)    blood clot left shoulder 2015   Pneumonia    Sleep apnea    uses cpap   Patient Active Problem List   Diagnosis Date Noted   Osteoarthritis of right hip 09/04/2021   Radiculopathy 06/07/2019   Lumbar herniated disc 08/29/2017   Right shoulder pain 06/28/2015   Forearm contusion 06/18/2015   Home Medication(s) Prior to Admission medications   Medication Sig Start Date End Date Taking? Authorizing Provider  acetaminophen (TYLENOL) 325 MG tablet Take 2 tablets (650 mg total) by mouth every 6 (six) hours as needed. 02/03/23  Yes Tanda Rockers A, DO  cyclobenzaprine (FLEXERIL) 10 MG tablet Take 1 tablet (10 mg total) by mouth 2 (two) times daily as needed for muscle spasms. 02/03/23  Yes Tanda Rockers A, DO  diclofenac Sodium (VOLTAREN ARTHRITIS PAIN) 1 % GEL Apply 2 g topically 4 (four) times daily for  7 days. 02/03/23 02/10/23 Yes Sloan Leiter, DO  apixaban (ELIQUIS) 2.5 MG TABS tablet Take 1 tablet (2.5 mg total) by mouth 2 (two) times daily. 09/04/21 10/19/21  Clois Dupes, PA-C  atorvastatin (LIPITOR) 10 MG tablet Take 10 mg by mouth daily.    [provider]  benazepril (LOTENSIN) 40 MG tablet Take 40 mg by mouth daily.    [provider]  chlorthalidone (HYGROTON) 25 MG tablet Take 12.5 mg by mouth daily.    [provider]  diltiazem (CARDIZEM CD) 240 MG 24 hr capsule Take 240 mg by mouth daily.    [provider]  diphenhydrAMINE (BENADRYL) 25 MG tablet Take 1 tablet (25 mg total) by mouth every 6 (six) hours as needed. 11/13/20   Tanda Rockers, PA-C  EPINEPHrine 0.3 mg/0.3 mL IJ SOAJ injection Inject 0.3 mg into the muscle as needed for anaphylaxis. 11/13/20   Hyman Hopes, Margaux, PA-C  famotidine (PEPCID) 20 MG tablet Take 1 tablet (20 mg total) by mouth 2 (two) times daily. Patient taking differently: Take 20 mg by mouth 2 (two) times daily as needed for heartburn or indigestion. 11/13/20   Tanda Rockers, PA-C  HYDROcodone-acetaminophen (NORCO) 10-325 MG tablet Take 1 tablet by mouth 3 (three) times daily as needed for pain 11/19/21     triamcinolone cream (KENALOG) 0.1 % Apply 1 application  topically 2 (two) times daily as needed (irritation).  [provider]                                                                                                                                    Past Surgical History Past Surgical History:  Procedure Laterality Date   ANTERIOR CERVICAL DECOMPRESSION/DISCECTOMY FUSION 4 LEVELS N/A 06/07/2019   Procedure: ANTERIOR CERVICAL DECOMPRESSION FUSION CERVICAL 4-5, CERVICAL 5-6, CERVICAL 6-7 WITH INSTRUMENTATION AND ALLOGRAFT;  Surgeon: Estill Bamberg, MD;  Location: [facility] OR;  Service: Orthopedics;  Laterality: N/A;   LUMBAR LAMINECTOMY/DECOMPRESSION MICRODISCECTOMY N/A 09/01/2017   Procedure: Lumbar  four-five Laminectomy/Microdiscectomy;  Surgeon: Lisbeth Renshaw, MD;  Location: [facility] OR;  Service: Neurosurgery;  Laterality: N/A;   TOTAL HIP ARTHROPLASTY Right 09/04/2021   Procedure: TOTAL HIP ARTHROPLASTY ANTERIOR APPROACH;  Surgeon: Samson Frederic, MD;  Location: [facility] OR[facility];  Service: Orthopedics;  Laterality: Right;  150   WISDOM TOOTH EXTRACTION     Family History History reviewed. No pertinent family history.  Social History Social History   Tobacco Use   Smoking status: Former    Types: Cigarettes   Smokeless tobacco: Never  Vaping Use   Vaping status: Every Day   Devices: smokes hemp for pain   Substance Use Topics   Alcohol use: Yes    Alcohol/week: 0.0 standard drinks of alcohol    Comment: occ   Drug use: No   Allergies Shellfish allergy and Promethazine  Review of Systems Review of Systems  Constitutional:  Negative for chills and fever.  Respiratory:  Negative for chest tightness and shortness of breath.   Cardiovascular:  Negative for chest pain, palpitations and leg swelling.  Gastrointestinal:  Negative for abdominal pain.  Musculoskeletal:  Positive for arthralgias and gait problem.  All other systems reviewed and are negative.   Physical Exam Vital Signs  I have reviewed the triage vital signs BP 136/86   Pulse 64   Temp 99 F (37.2 C) (Oral)   Resp 18   Ht 5\' 8"  (1.727 m)   Wt 116.9 kg   SpO2 100%   BMI 39.19 kg/m  Physical Exam Vitals and nursing note reviewed.  Constitutional:      General: He is not in acute distress.    Appearance: Normal appearance. He is well-developed. He is not ill-appearing.  HENT:     Head: Normocephalic and atraumatic.     Right Ear: External ear normal.     Left Ear: External ear normal.     Nose: Nose normal.     Mouth/Throat:     Mouth: Mucous membranes are moist.  Eyes:     General: No scleral icterus.       Right eye: No discharge.        Left eye: No discharge.  Cardiovascular:     Rate and Rhythm:  Normal rate.  Pulmonary:     Effort: Pulmonary effort is normal. No respiratory distress.  Breath sounds: No stridor.  Abdominal:     General: Abdomen is flat. There is no distension.     Tenderness: There is no guarding.  Musculoskeletal:        General: No deformity.     Cervical back: No rigidity.       Legs:     Comments: No significant joint swelling. LE NVI bilateral No significant LE edema Negative anterior/posterior drawer.  He has discomfort with provocative testing to the LCL, no pain with provocative testing of the MCL  No erythema  Quadricep, Achilles and patella tendons appear intact right`  Skin:    General: Skin is warm and dry.     Coloration: Skin is not cyanotic, jaundiced or pale.  Neurological:     Mental Status: He is alert and oriented to person, place, and time.     GCS: GCS eye subscore is 4. GCS verbal subscore is 5. GCS motor subscore is 6.  Psychiatric:        Speech: Speech normal.        Behavior: Behavior normal. Behavior is cooperative.     ED Results and Treatments Labs (all labs ordered are listed, but only abnormal results are displayed) Labs Reviewed - No data to display                                                                                                                        Radiology DG Knee Complete 4 Views Right  Result Date: 02/03/2023 CLINICAL DATA:  Knee pain/injury EXAM: RIGHT KNEE - COMPLETE 4+ VIEW COMPARISON:  None Available. FINDINGS: No fracture or dislocation is seen. Mild degenerative changes, most prominent in the patellofemoral compartment. Visualized soft tissues are within normal limits. No suprapatellar knee effusion. IMPRESSION: No fracture or dislocation is seen. Mild degenerative changes. Electronically Signed   By: Charline Bills M.D.   On: 02/03/2023 20:31    Pertinent labs & imaging results that were available during my care of the patient were reviewed by me and considered in my medical decision  making (see MDM for details).  Medications Ordered in ED Medications  acetaminophen (TYLENOL) tablet 650 mg (650 mg Oral Given 02/03/23 2139)  cyclobenzaprine (FLEXERIL) tablet 5 mg (5 mg Oral Given 02/03/23 2210)  ketorolac (TORADOL) tablet 10 mg (10 mg Oral Given 02/03/23 2210)  HYDROmorphone (DILAUDID) injection 1 mg (1 mg Intramuscular Given 02/03/23 2211)  Procedures Procedures  (including critical care time)  Medical Decision Making / ED Course    Medical Decision Making:    Jacques Popke is a 52 y.o. male  with past medical history as below, significant for arthritis, gout, hypertension, PVD, OSA on CPAP who presents to the ED with complaint of right knee injury. The complaint involves an extensive differential diagnosis and also carries with it a high risk of complications and morbidity.  Serious etiology was considered. Ddx includes but is not limited to: Sprain, strain, dislocation, fracture, soft tissue injury, etc.  Complete initial physical exam performed, notably the patient was in no acute distress, sitting in wheelchair.    Reviewed and confirmed nursing documentation for past medical history, family history, social history.  Vital signs reviewed.       Brief summary: 52 year old male history as above here with right side knee pain after injury.  Pain lateral aspect of knee, productive testing elicits discomfort to the LCL.  Tendons appear intact quadricep patella and Achilles.  Pain improved, give knee brace, crutches, follow-up with orthopedics.  Multimodal pain control.  I have low suspicion for septic joint  He was seen in urgent care earlier today, discharged in stable condition  The patient improved significantly and was discharged in stable condition. Detailed discussions were had with the patient regarding current findings, and  need for close f/u with PCP or on call doctor. The patient has been instructed to return immediately if the symptoms worsen in any way for re-evaluation. Patient verbalized understanding and is in agreement with current care plan. All questions answered prior to discharge.                Additional history obtained: -Additional history obtained from family -External records from outside source obtained and reviewed including: Chart review including previous notes, labs, imaging, consultation notes including  PDMP Primary care documentation, recent urgent care documentation   Lab Tests: na  EKG   EKG Interpretation Date/Time:    Ventricular Rate:    PR Interval:    QRS Duration:    QT Interval:    QTC Calculation:   R Axis:      Text Interpretation:           Imaging Studies ordered: I ordered imaging studies including knee xr I independently visualized the following imaging with scope of interpretation limited to determining acute life threatening conditions related to emergency care; findings noted above I independently visualized and interpreted imaging. I agree with the radiologist interpretation   Medicines ordered and prescription drug management: Meds ordered this encounter  Medications   acetaminophen (TYLENOL) tablet 650 mg   cyclobenzaprine (FLEXERIL) tablet 5 mg   ketorolac (TORADOL) tablet 10 mg   HYDROmorphone (DILAUDID) injection 1 mg   cyclobenzaprine (FLEXERIL) 10 MG tablet    Sig: Take 1 tablet (10 mg total) by mouth 2 (two) times daily as needed for muscle spasms.    Dispense:  20 tablet    Refill:  0   acetaminophen (TYLENOL) 325 MG tablet    Sig: Take 2 tablets (650 mg total) by mouth every 6 (six) hours as needed.    Dispense:  36 tablet    Refill:  0   diclofenac Sodium (VOLTAREN ARTHRITIS PAIN) 1 % GEL    Sig: Apply 2 g topically 4 (four) times daily for 7 days.    Dispense:  100 g    Refill:  0    -I have reviewed the  patients  home medicines and have made adjustments as needed   Consultations Obtained: na   Cardiac Monitoring: Continuous pulse oximetry interpreted by myself, 99% on RA.    Social Determinants of Health:  Diagnosis or treatment significantly limited by social determinants of health: former smoker and obesity   Reevaluation: After the interventions noted above, I reevaluated the patient and found that they have improved  Co morbidities that complicate the patient evaluation  Past Medical History:  Diagnosis Date   Arthritis    Complication of anesthesia    a little combative after back surgery   DVT (deep venous thrombosis) (HCC)    GERD (gastroesophageal reflux disease)    Gout    Hypertension    Peripheral vascular disease (HCC)    blood clot left shoulder 2015   Pneumonia    Sleep apnea    uses cpap      Dispostion: Disposition decision including need for hospitalization was considered, and patient discharged from emergency department.    Final Clinical Impression(s) / ED Diagnoses Final diagnoses:  Acute pain of right knee  Injury of lateral collateral ligament (LCL) of knee        Sloan Leiter, DO 02/03/23 2303

## 2023-02-03 NOTE — ED Triage Notes (Signed)
Pt states hit right knee on bed post on Saturday, since has had worsening pain and swelling  Pain with weight bearing   Took hydrocodone as prescribed
